# Patient Record
Sex: Female | Born: 1980 | Race: White | Hispanic: Yes | Marital: Single | Smoking: Never smoker
Health system: Southern US, Community
[De-identification: ages and names within clinical notes are randomized; demographics above are authoritative.]

## PROBLEM LIST (undated history)

## (undated) DIAGNOSIS — Z789 Other specified health status: Secondary | ICD-10-CM

## (undated) HISTORY — PX: CHOLECYSTECTOMY: SHX55

---

## 2007-03-31 ENCOUNTER — Inpatient Hospital Stay (HOSPITAL_COMMUNITY): Admission: AD | Admit: 2007-03-31 | Discharge: 2007-04-10 | Payer: Self-pay | Admitting: Obstetrics

## 2007-04-20 ENCOUNTER — Inpatient Hospital Stay (HOSPITAL_COMMUNITY): Admission: AD | Admit: 2007-04-20 | Discharge: 2007-04-20 | Payer: Self-pay | Admitting: Obstetrics

## 2007-04-21 ENCOUNTER — Inpatient Hospital Stay (HOSPITAL_COMMUNITY): Admission: AD | Admit: 2007-04-21 | Discharge: 2007-04-21 | Payer: Self-pay | Admitting: Obstetrics

## 2007-06-27 ENCOUNTER — Inpatient Hospital Stay (HOSPITAL_COMMUNITY): Admission: AD | Admit: 2007-06-27 | Discharge: 2007-06-27 | Payer: Self-pay | Admitting: Obstetrics

## 2007-06-28 ENCOUNTER — Inpatient Hospital Stay (HOSPITAL_COMMUNITY): Admission: AD | Admit: 2007-06-28 | Discharge: 2007-06-28 | Payer: Self-pay | Admitting: Obstetrics

## 2007-06-29 ENCOUNTER — Inpatient Hospital Stay (HOSPITAL_COMMUNITY): Admission: AD | Admit: 2007-06-29 | Discharge: 2007-07-03 | Payer: Self-pay | Admitting: Obstetrics

## 2007-06-30 ENCOUNTER — Encounter: Payer: Self-pay | Admitting: Obstetrics & Gynecology

## 2010-08-03 ENCOUNTER — Other Ambulatory Visit (HOSPITAL_COMMUNITY): Payer: Self-pay | Admitting: Family Medicine

## 2010-08-03 ENCOUNTER — Other Ambulatory Visit: Payer: Self-pay

## 2010-08-03 ENCOUNTER — Emergency Department (HOSPITAL_COMMUNITY)
Admission: EM | Admit: 2010-08-03 | Discharge: 2010-08-03 | Payer: Self-pay | Source: Home / Self Care | Admitting: Family Medicine

## 2010-08-03 LAB — POCT URINALYSIS DIPSTICK
Specific Gravity, Urine: 1.02 (ref 1.005–1.030)
Urine Glucose, Fasting: NEGATIVE mg/dL
Urobilinogen, UA: 0.2 mg/dL (ref 0.0–1.0)
pH: 6 (ref 5.0–8.0)

## 2010-08-03 LAB — AST: AST: 20 U/L (ref 0–37)

## 2010-08-03 LAB — ALT: ALT: 15 U/L (ref 0–35)

## 2010-08-07 ENCOUNTER — Ambulatory Visit (HOSPITAL_COMMUNITY)
Admission: RE | Admit: 2010-08-07 | Discharge: 2010-08-07 | Disposition: A | Discharge status: Home / Self Care | Payer: Self-pay | Source: Ambulatory Visit

## 2010-08-07 DIAGNOSIS — K8020 Calculus of gallbladder without cholecystitis without obstruction: Secondary | ICD-10-CM | POA: Insufficient documentation

## 2010-08-07 DIAGNOSIS — R1011 Right upper quadrant pain: Secondary | ICD-10-CM | POA: Insufficient documentation

## 2010-08-08 ENCOUNTER — Encounter: Payer: Self-pay | Admitting: Family Medicine

## 2010-09-03 HISTORY — PX: OTHER SURGICAL HISTORY: SHX169

## 2010-09-10 ENCOUNTER — Encounter (HOSPITAL_COMMUNITY)
Admission: RE | Admit: 2010-09-10 | Discharge: 2010-09-10 | Disposition: A | Discharge status: Home / Self Care | Payer: Self-pay | Source: Ambulatory Visit | Attending: Surgery | Admitting: Surgery

## 2010-09-10 DIAGNOSIS — Z01812 Encounter for preprocedural laboratory examination: Secondary | ICD-10-CM | POA: Insufficient documentation

## 2010-09-10 LAB — CBC
HCT: 40.1 % (ref 36.0–46.0)
Hemoglobin: 13.2 g/dL (ref 12.0–15.0)
MCH: 26 pg (ref 26.0–34.0)
MCV: 79.1 fL (ref 78.0–100.0)
RDW: 14.4 % (ref 11.5–15.5)

## 2010-09-11 ENCOUNTER — Other Ambulatory Visit (HOSPITAL_COMMUNITY): Payer: Self-pay

## 2010-09-14 ENCOUNTER — Ambulatory Visit (HOSPITAL_COMMUNITY)
Admission: RE | Admit: 2010-09-14 | Discharge: 2010-09-14 | Disposition: A | Discharge status: Home / Self Care | Payer: Self-pay | Source: Ambulatory Visit | Attending: Surgery | Admitting: Surgery

## 2010-09-14 ENCOUNTER — Other Ambulatory Visit: Payer: Self-pay | Admitting: Surgery

## 2010-09-14 ENCOUNTER — Ambulatory Visit (HOSPITAL_COMMUNITY): Payer: Self-pay

## 2010-09-14 DIAGNOSIS — K8010 Calculus of gallbladder with chronic cholecystitis without obstruction: Secondary | ICD-10-CM | POA: Insufficient documentation

## 2010-09-15 NOTE — Op Note (Signed)
NAMECHEYNE, Ashley Garza  ACCOUNT NO.:  [id number]  MEDICAL RECORD NO.:  [id number]           PATIENT TYPE:  O  LOCATION:  SDSC                         FACILITY:  [facility]  PHYSICIAN:  Wilmon Arms. Corliss Skains, M.D. DATE OF BIRTH:  03/18/1981  DATE OF PROCEDURE:  09/14/2010 DATE OF DISCHARGE:  09/14/2010                              OPERATIVE REPORT   PREOPERATIVE DIAGNOSIS:  Chronic calculus cholecystitis.  POSTOPERATIVE DIAGNOSIS:  Chronic calculus cholecystitis.  PROCEDURE PERFORMED:  Laparoscopic cholecystectomy with intraoperative cholangiogram.  SURGEON:  Wilmon Arms. Dannya Pitkin, MD  ANESTHESIA:  General.  INDICATIONS:  This is a 30 year old female who presents with several episodes of right-sided abdominal pain radiating through to her back. Ultrasound showed multiple gallstones.  Liver function tests were normal.  She presents now for cholecystectomy.  DESCRIPTION OF PROCEDURE:  The patient was brought to the operating room and placed in supine position on the operating room table.  After an adequate level of general anesthesia was obtained, the patient's abdomen was prepped with ChloraPrep and draped in sterile fashion.  Time-out was taken to assure the proper patient, proper procedure.  We infiltrated the area below the umbilicus with 0.25% Marcaine with epinephrine.  A transverse incision was made.  Dissection was carried down to the fascia.  The fascia was opened vertically.  We entered the peritoneal cavity bluntly.  A stay suture of 0 Vicryl was placed in the fascial opening.  The Hasson cannula was inserted and secured to stay suture. Pneumoperitoneum was obtained by insufflating CO2, maintaining a maximum pressure of 15 mmHg.  The laparoscope was inserted and the patient was positioned in reverse Trendelenburg tilted to her left.  An 11-mm port was placed in the subxiphoid position.  Two 5-mm ports were placed in the right upper quadrant.  The gallbladder was grasped  with a clamp and lifted.  There were lot of adhesions to the gallbladder.  These were taken down bluntly and with cautery.  We opened the peritoneum around the hilum of the gallbladder.  The cystic duct and cystic artery was circumferentially dissected.  We ligated the cystic duct with a clip distally.  A small opening was created on the cystic duct.  A Cook cholangiogram catheter was inserted through a stab incision and threaded into the cystic duct.  A cholangiogram was then obtained which showed good flow proximally and distally in the biliary tree with no sign of filling defect.  The catheter was removed, and the cystic duct was ligated with clips and divided.  The cystic artery was ligated with clips and divided.  Cautery was then used to dissect the gallbladder free from the liver.  Some bile was spilled but no stones were noted. The gallbladder was placed in an Endocatch sac.  We thoroughly irrigated the gallbladder fossa.  Hemostasis was obtained with cautery.  We suctioned at the irrigation.  The gallbladder and the Endocatch sac were removed through the umbilical port site.  A pursestring suture was used to close the umbilical fascia.  We again irrigated the right upper quadrant. Pneumoperitoneum was then released as we removed the trocars.  A 4-0 Monocryl was used to close the skin incisions.  Steri-Strips and clean  dressings were applied.  The patient was extubated and brought to recovery in stable condition.  All sponge, instrument, needle counts were correct.     Wilmon Arms. Corliss Skains, M.D.     MKT/MEDQ  D:  09/14/2010  T:  09/15/2010  Job:  [id number]  Electronically Signed by Manus Rudd M.D. on 09/15/2010 01:09:51 PM

## 2010-10-26 ENCOUNTER — Other Ambulatory Visit (HOSPITAL_COMMUNITY): Payer: Self-pay | Admitting: Family Medicine

## 2010-11-17 NOTE — Op Note (Signed)
NAMEJENNIFFER, VESSELS  ACCOUNT NO.:  [id number]   MEDICAL RECORD NO.:  [id number]          PATIENT TYPE:  INP   LOCATION:  9125                          [facility]   PHYSICIAN:  Roseanna Rainbow, M.D.DATE OF BIRTH:  07-Mar-1981   DATE OF PROCEDURE:  06/30/2007  DATE OF DISCHARGE:                               OPERATIVE REPORT   PREOPERATIVE DIAGNOSES:  1. Intrauterine pregnancy at 34+ weeks.  2. Oligohydramnios.  3. Elevated umbilical artery Doppler systolic/diastolic ratio.  4. Breech presentation.   POSTOPERATIVE DIAGNOSES:  1. Intrauterine pregnancy at 34+ weeks.  2. Oligohydramnios.  3. Elevated umbilical artery Doppler systolic/diastolic ratio.  4. Breech presentation.   PROCEDURE:  Primary low uterine flap elliptical cesarean delivery via  Pfannenstiel skin incision.   SURGEON:  Roseanna Rainbow, M.D.   ANESTHESIA:  Spinal.   PATHOLOGY:  Placenta.   ESTIMATED BLOOD LOSS:  600 mL.   COMPLICATIONS:  None.   INTRAVENOUS FLUIDS:  Per Anesthesiology.   DESCRIPTION OF PROCEDURE:  The patient was taken to the operating room  with an IV running.  A spinal anesthetic was then administered.  She was  placed in the dorsal supine position with a leftward tilt and prepped  and draped in the usual sterile fashion.  After a time out had been  completed, a Pfannenstiel skin incision was then made with the scalpel  and carried down to the underlying fascia.  The fascia was nicked in the  midline.  The fascial incision was then extended bilaterally with curved  Mayo scissors.  The superior aspect of the fascial incision was tented  up and the underlying rectus muscles dissected off.  The inferior aspect  of the fascial incision was manipulated in a similar fashion.  The  rectus muscles were separated in the midline.  The parietal peritoneum  was tented up and entered sharply.  This incision was then extended  superiorly and inferiorly with good  visualization of the bladder.  The  Alexis retractor was then placed into the incision.  The vesicouterine  peritoneum was tented up and entered sharply.  This incision was then  extended bilaterally and the bladder flap created bluntly.  The lower  uterine segment was then incised in a transverse fashion with the  scalpel.  The incision was then extended bluntly.  The position of the  infant was complete breech.  A complete breech extraction was then  performed.  The infant's head was delivered atraumatically.  The infant  was handed off to the awaiting neonatologist.  An umbilical artery pH  was sent and was 7.34.  The placenta was then removed.  The intrauterine  cavity was evacuated of any remaining amniotic fluid, clots, and debris  with a moist laparotomy sponge.  The uterine incision was then  reapproximated in a running interlocking fashion using 0 Monocryl.  A  second imbricating layer of the same suture was then placed.  Adequate  hemostasis was noted.  The paracolic gutters were then irrigated.  The  retractor was then removed.  The parietal peritoneum was then  reapproximated in a running fashion using 2-0 Vicryl.  The fascia was  closed in  a running fashion using a running suture of 0 Vicryl.  The  skin was closed with staples.  At the close of the procedure, the  instrument and pack counts were said to be correct x2.  A gram of  cephazolin had been given at cord clamp.  The patient was taken to the  PACU awake and in stable condition.      Roseanna Rainbow, M.D.  Electronically Signed     LAJ/MEDQ  D:  06/30/2007  T:  06/30/2007  Job:  [id number]   cc:   Kathreen Cosier, M.D.  Fax: [phone]

## 2010-11-20 ENCOUNTER — Inpatient Hospital Stay (HOSPITAL_COMMUNITY)
Admission: AD | Admit: 2010-11-20 | Discharge: 2010-11-20 | Disposition: A | Discharge status: Home / Self Care | Payer: Self-pay | Source: Ambulatory Visit | Attending: Family Medicine | Admitting: Family Medicine

## 2010-11-20 DIAGNOSIS — O209 Hemorrhage in early pregnancy, unspecified: Secondary | ICD-10-CM | POA: Insufficient documentation

## 2010-11-20 LAB — CBC
HCT: 39.3 % (ref 36.0–46.0)
Hemoglobin: 12.9 g/dL (ref 12.0–15.0)
MCH: 27.2 pg (ref 26.0–34.0)
Platelets: 281 10*3/uL (ref 150–400)
RBC: 4.75 MIL/uL (ref 3.87–5.11)
WBC: 7.9 10*3/uL (ref 4.0–10.5)

## 2010-11-20 LAB — HCG, QUANTITATIVE, PREGNANCY: hCG, Beta Chain, Quant, S: 64 m[IU]/mL — ABNORMAL HIGH (ref <5)

## 2010-11-20 LAB — URINALYSIS, ROUTINE W REFLEX MICROSCOPIC
Glucose, UA: NEGATIVE mg/dL
Specific Gravity, Urine: 1.015 (ref 1.005–1.030)
pH: 6 (ref 5.0–8.0)

## 2010-11-20 LAB — POCT PREGNANCY, URINE: Preg Test, Ur: NEGATIVE

## 2010-11-20 LAB — WET PREP, GENITAL: Yeast Wet Prep HPF POC: NONE SEEN

## 2010-11-20 LAB — URINE MICROSCOPIC-ADD ON

## 2010-11-20 NOTE — Discharge Summary (Signed)
NAMEAERIELLE, Ashley Garza  ACCOUNT NO.:  [id number]   MEDICAL RECORD NO.:  [id number]          PATIENT TYPE:  INP   LOCATION:  9320                          [facility]   PHYSICIAN:  Kathreen Cosier, M.D.DATE OF BIRTH:  08/13/1980   DATE OF ADMISSION:  06/29/2007  DATE OF DISCHARGE:  07/03/2007                               DISCHARGE SUMMARY   This is a 30 year old primigravida, [facility] January 31, who had premature  ruptures from 21 weeks and was kept on bed rest for the duration of her  pregnancy.  She was seen,  and had severe oligohydramnios with IUGR at  34 weeks and she was also breech presentation.  She had elevated  umbilical artery Dopplers and was scheduled for an elective C-section on  December 31.  She came in on December 25 with decreased fetal movement  and underwent primary low transverse cesarean section and a viable fetus  weighing 3 pounds and 14 ounces was stopped.  The patient did well.  Her  hemoglobin was 11.2.  She was discharged on the third postoperative day  and return to regular diet, on Tylox for pain to see me in six weeks.   DISCHARGE DIAGNOSES:  1. Status post prolonged rupture of membranes from 21 weeks.  2. Breech presentation.  3. Decreased fetal movement.  4. Increased umbilical artery Dopplers.           ______________________________  Kathreen Cosier, M.D.     BAM/MEDQ  D:  07/20/2007  T:  07/20/2007  Job:  [id number]

## 2010-11-20 NOTE — Discharge Summary (Signed)
NAMELAVINIA, MCNEELY  ACCOUNT NO.:  [id number]   MEDICAL RECORD NO.:  [id number]          PATIENT TYPE:  INP   LOCATION:  9157                          [facility]   PHYSICIAN:  Kathreen Cosier, M.D.DATE OF BIRTH:  08-20-1980   DATE OF ADMISSION:  03/31/2007  DATE OF DISCHARGE:  04/10/2007                               DISCHARGE SUMMARY   The patient is a 30 year old gravida 1, EDC August 05, 2007, 21+ weeks  pregnant, who was admitted because she was leaking fluid x1 week.  She  had no pain.  Ultrasound showed normal-appearing fetus with normal  kidneys but no amniotic fluid, so the admitting diagnosis was ruptured  membranes at 21 weeks.  The patient was observed and her temperatures  all remained normal and there was minimal leakage of fluid over the  course of the next few days.  The patient refused termination of the  pregnancy and she did not want to be hospitalized, so she was taught to  use a thermometer and she was instructed through the interpreter to be  on total bed rest at home, no sex, no lifting, to do her temperatures  every 4 hours and if she had a temperature elevation above 100.5, she  was to come immediately to the hospital.  It was explained that she  could be infected and would lose her baby and possibly her uterus.  The  patient stated that she understood.  She had an ultrasound prior to  discharge and the AFI was 4.7, up from 3.7.  she was discharged on total  bed rest, to see me in the office weekly, take her temperature every 4  hours.   DISCHARGE DIAGNOSIS:  Status post ruptured membranes at 21 weeks.           ______________________________  Kathreen Cosier, M.D.     BAM/MEDQ  D:  05/03/2007  T:  05/03/2007  Job:  [id number]

## 2010-11-21 LAB — GC/CHLAMYDIA PROBE AMP, GENITAL: Chlamydia, DNA Probe: NEGATIVE

## 2010-11-22 ENCOUNTER — Inpatient Hospital Stay (HOSPITAL_COMMUNITY)
Admission: AD | Admit: 2010-11-22 | Discharge: 2010-11-22 | Disposition: A | Discharge status: Home / Self Care | Payer: Self-pay | Source: Ambulatory Visit | Attending: Obstetrics & Gynecology | Admitting: Obstetrics & Gynecology

## 2010-11-22 DIAGNOSIS — O209 Hemorrhage in early pregnancy, unspecified: Secondary | ICD-10-CM | POA: Insufficient documentation

## 2010-11-27 ENCOUNTER — Inpatient Hospital Stay (HOSPITAL_COMMUNITY)
Admission: AD | Admit: 2010-11-27 | Discharge: 2010-11-27 | Disposition: A | Discharge status: Home / Self Care | Payer: Self-pay | Source: Ambulatory Visit | Attending: Obstetrics & Gynecology | Admitting: Obstetrics & Gynecology

## 2010-11-27 DIAGNOSIS — O039 Complete or unspecified spontaneous abortion without complication: Secondary | ICD-10-CM

## 2010-12-18 ENCOUNTER — Ambulatory Visit: Payer: Self-pay | Admitting: Advanced Practice Midwife

## 2011-04-09 LAB — CBC
HCT: 33.3 — ABNORMAL LOW
Hemoglobin: 11.2 — ABNORMAL LOW
MCV: 82.6
RBC: 4.02
RBC: 4.16
RDW: 16.3 — ABNORMAL HIGH
WBC: 11.8 — ABNORMAL HIGH
WBC: 14.8 — ABNORMAL HIGH

## 2011-04-09 LAB — RPR: RPR Ser Ql: NONREACTIVE

## 2011-04-15 LAB — CBC
HCT: 32.6 — ABNORMAL LOW
Hemoglobin: 11.2 — ABNORMAL LOW
MCHC: 34.4
MCV: 85.3
RDW: 14.8 — ABNORMAL HIGH

## 2011-04-21 ENCOUNTER — Other Ambulatory Visit: Payer: Self-pay | Admitting: Advanced Practice Midwife

## 2011-04-21 ENCOUNTER — Ambulatory Visit (INDEPENDENT_AMBULATORY_CARE_PROVIDER_SITE_OTHER): Payer: Self-pay | Admitting: Family Medicine

## 2011-04-21 ENCOUNTER — Other Ambulatory Visit: Payer: Self-pay | Admitting: Family Medicine

## 2011-04-21 VITALS — BP 111/72 | Temp 97.7°F | Ht 61.5 in | Wt 153.0 lb

## 2011-04-21 DIAGNOSIS — O09219 Supervision of pregnancy with history of pre-term labor, unspecified trimester: Secondary | ICD-10-CM

## 2011-04-21 DIAGNOSIS — Z3682 Encounter for antenatal screening for nuchal translucency: Secondary | ICD-10-CM

## 2011-04-21 DIAGNOSIS — Z3480 Encounter for supervision of other normal pregnancy, unspecified trimester: Secondary | ICD-10-CM

## 2011-04-21 DIAGNOSIS — O09899 Supervision of other high risk pregnancies, unspecified trimester: Secondary | ICD-10-CM | POA: Insufficient documentation

## 2011-04-21 DIAGNOSIS — Z23 Encounter for immunization: Secondary | ICD-10-CM

## 2011-04-21 LAB — POCT URINALYSIS DIP (DEVICE)
Leukocytes, UA: NEGATIVE
Nitrite: NEGATIVE
Protein, ur: NEGATIVE mg/dL
Urobilinogen, UA: 0.2 mg/dL (ref 0.0–1.0)
pH: 6 (ref 5.0–8.0)

## 2011-04-21 LAB — HIV ANTIBODY (ROUTINE TESTING W REFLEX): HIV: NONREACTIVE

## 2011-04-21 MED ORDER — INFLUENZA VIRUS VACC SPLIT PF IM SUSP: 0.5000 mL | Freq: Once | INTRAMUSCULAR | Status: DC

## 2011-04-21 MED ORDER — PRENATAL 19 PO CHEW: 1.0000 | CHEWABLE_TABLET | Freq: Every day | ORAL | Status: DC

## 2011-04-21 NOTE — Patient Instructions (Signed)
Cuidados prenatales Atencin antes del parto (Prenatal Care, Antepartum Care) QU SON LOS CUIDADOS PRENATALES?  Cuidados prenatales significan la asistencia de la salud antes de que nazca el beb. Cudese usted y tambin cuide al beb del siguiente modo:   Siga los cuidados prenatales desde comienzos del Psychiatrist. Si sabe que est embarazada o piensa que podra estarlo, comunquese con su mdico lo antes posible. Arregle una visita para un examen general o prenatal.   Siga los cuidados prenatales de [place] regular. Siga el esquema para realizar anlisis de [place] y otras pruebas necesarias y Joelyn Oms a todas las citas.   Si hace todo esto podr [employer] y la del beb durante todo el Psychiatrist.   Los cuidados prenatales deben incluir una evaluacin de las necesidades mdicas, [place], [place], psicolgicas y [place]ie para la pareja y la historia [place], [place] y gentica de la familia de la madre y el padre.   Comente con su mdico:   Los medicamentos prescriptos, de venta libre y las hierbas medicinales que toma.   Si consume drogas, alcohol o fuma.   Si sufre violencia o abuso familiar.   Las vacunas que ha recibido.   Su nutricin y Surveyor, mining.   La actividad fsica Development worker, international aid.   Peligros ambientales y Forensic psychologist, del hogar y [place].   Historia de enfermedades de transmisin sexual que hayan sufrido usted y Risk analyst.   Embarazos previos.  PORQU LOS CUIDADOS PRENATALES SON TAN IMPORTANTES?  Si la controla con regularidad, el profesional tendr la oportunidad de Clinical research associate problemas precozmente, de modo que puede tratarlos lo antes posible. Tambin puede llegar a Photographer. Muchos estudios han demostrado que una atencin prenatal temprana y regular es importante tanto para la salud de las madres como de sus bebs.  Concha Se EMBARAZADA COMO PUEDO CUIDARME?  El cuidado de la mujer antes de quedar embarazada la ayuda a Warehouse manager un Psychiatrist  saludable y a Technical sales engineer las posibilidades de tener un beb con problemas. Estas son las formas de cuidarse antes de quedan embarazada:   Consuma alimentos saludables, realice actividad fsica regularmente (lo ms aconsejable es 30 minutos por da, [employer]) y descanse y duerma lo suficiente. Converse con el profesional acerca de cules son los alimentos y ejercicios que ms le convienen.   Tome 400 microgramos (mcg) de cido flico (una de las vitaminas del grupo B) todos [place]. Lo ms aconsejable es tomar un multivitamnico que contenga esta cantidad de cido flico. Si toma la cantidad suficiente de cido flico sinttico (manufacturado) todos los [place], antes de [place] y a comienzos del Psychiatrist, podr evitar que el beb sufra ciertos defectos. Hay cereales para el desayuno y otros productos que contienen granos a los que se les ha agregado cido flico, pero no todos contienen 400 mcg por porcin. Controle las etiquetas del multivitamnico o del cereal para conocer la cantidad de cido flico que contienen.   Verifique que cuenta con todas las vacunaciones, especialmente la de la [place]. La rubola puede causar graves defectos al beb. La varicela es otra enfermedad que debe evitarse durante el [place]. Si ha sufrido varicela o rubola en el pasado, usted est inmunizada.   Informe al mdico acerca de todos los medicamentos prescriptos, de venta libre o hierbas que toma. Algunos medicamentos no son seguros para tomarlos Academic librarian.   Deje de fumar, de beber alcohol o de tomar drogas. Pdale ayuda al mdico, si es necesario.   Comente y trate cualquier  problema mdico, social o psicolgico antes de quedar embarazada.   Comente cualquier historia de problemas genticos en su familia o en la del padre. Siempre que sea posible, haga pruebas genticas antes de quedar embarazada.   Comente a su mdico si es vctima de maltratos fsicos o emocionales.   Comente con el  profesional si est expuesta a sustancias qumicas perjudiciales en su lugar de trabajo o en el lugar en el que vive.   Comente con el profesional si considera que su trabajo o las horas que trabaja pueden ser perjudiciales y debera modificarlos.   El padre debe estar incluido en todas las tomas de decisiones con todos los aspectos del [place], el [place] de parto y Interior.   Si tiene [employer] Home	[place], asegrese que esta cubierta por Psychiatrist.  RECIN ME ENTERO QUE ESTOY EMBARAZADA COMO PUEDO CUIDARME?  Aqu hay algunas indicaciones para cuidarse usted misma y la preciosa nueva vida que crece en su interior.   Contine tomando el multivitamnico con 400 microgramos (mcg) de cido [employer].   Siga los cuidados prenatales desde comienzos del embarazo. No importa si este es Contractor o si ya tiene otros nios. Es importante que consulte con un profesional durante el Psychiatrist. El Facilities manager en cada visita para verificar que usted y el beb estn sanos. Si hubiera problemas, tomar medidas para ayudarla a usted o a su beb.   Consuma una dieta saludable que incluya.   Frutas  Vegetales   Alimentos que contengan pocas grasas saturadas.   Granos.  Alimentos ricos en calcio.   Beba entre 6 y 8 vasos de lquidos por Futures trader.    Excepto que el profesional le indique lo contrario, trate de mantenerse fsicamente activa durante 30 minutos, casi todos los [address]  [address] de la [place]. Si no tiene tiempo, realice [place] en segmentos de 10 minutos, tres Teacher, early years/pre.   Si fuma, bebe alcohol o consume drogas DEJE DE HACERLO. Estas sustancias pueden causar daos crnicos al beb. Converse con el profesional que la asiste con respecto a los pasos a seguir para International aid/development worker. Converse con algn miembro de la comunidad religiosa, terapeuta, un amigo de confianza o su mdico, si est preocupada con respecto a su consumo de alcohol o drogas.   Consulte con el profesional antes  de tomar [employer], an los de [employer]. Algunos medicamentos no son seguros para tomarlos Academic librarian.   Descanse y duerma lo suficiente.   Evite jacuzzis y saunas durante el embarazo   No pueden tomarle radiografas, excepto que sea absolutamente necesario y con autorizacin del mdico. Le colocarn una pantalla de plomo sobre el abdomen para proteger al beb cuando los rayos x ingresen a su organismo   No limpie el lecho del gato si est embarazada. Puede contener un parsito que causa una infeccin denominada toxoplasmosis, que ocasiona defectos en el beb. Tambin utilice guantes cuando trabaje en las zonas del jardn en las que se encuentra el [place].   No coma carne, pescado o queso crudo o mal cocido.   Permanezca alejada de sustancias txicas como:   Insecticidas.  Solventes (como algunos limpiadores o disolvente de pinturas).   Plomo.  Mercurio.    Puede tener relaciones sexuales hasta el final del embarazo excepto que sufra algn problema mdico o tenga alguna dificultad en el embarazo y su mdico le aconseje que no las tenga.   No use tacones altos, especialmente durante la segunda mitad  del embarazo. Puede perder el equilibrio y caerse.   No haga viajes largos excepto que sea absolutamente necesario. Asegrese de [employer] visita al mdico antes de hacer el viaje.   No permanezca sentada en una posicin durante ms de 2 horas cuando viaje.   Lleve una copia de los registros mdicos cuando vaya de viaje.   Averige dnde [facility] en la ciudad que visitar, en caso de emergencia.   Los productos de limpieza ms peligrosos tienen advertencias para la mujer embarazada en la etiqueta. Consulte con el profesional con respecto a los productos si no est segura.   Limite o elimine su consumo de cafena proveniente del caf, t, gaseosas, medicamentos y chocolate.   Muchas mujeres siguen trabajando Academic librarian. Permanecer activa la ayuda  a mantenerse sana. Si tiene preguntas relacionadas con la seguridad o las horas que trabaja, consltelo con el profesional que la asiste.   Mantngase informada:   Lea libros.  Mire vdeos.   Concurra con el padre a las clases de preparto.  Converse con otras mams experimentadas.    Consulte con el profesional con respecto a las clases de preparto para usted y Camera operator. Las clases la ayudarn a usted y su compaero a prepararse para el nacimiento de su beb.   Busque un pediatra (mdico de bebs) y Civil engineer, contracting de los mtodos para Acupuncturist dolor durante el [place] de [place], [place] parto y la probabilidad de Neomia Dear cesrea.  NO PIENSO QUEDAR EMBARAZADA TODAVA, PERO HE ESCUCHADO QUE TODAS LAS MUJERES DEBEN TOMAR CIDO FLICO TODOS LOS DAS.  Todas las [employer] en edad frtil, an aquellas que tengan una posibilidad [place] de quedar embarazadas, deben tratar de tomar una cantidad suficiente de cido flico Muchos embarazos no son planeados. Muchas mujeres no saben que estn embarazadas y ciertos defectos congnitos se producen a comienzos del Psychiatrist. Tomar 400 microgramos (mcg) de cido flico todos los [employer] ayudara a Automotive engineer ciertos defectos congnitos que se producen a comienzos del Psychiatrist. Si una mujer comienza a tomar vitaminas en el segundo o tercer mes del [place], podra ser muy tarde para evitar algunos defectos congnitos. El cido flico puede tener otros efectos beneficiosos en la salud de la [place], [place] de prevenir defectos cngnitos.  CON QU FRECUENCIA DEBO VISITAR AL MDICO DURANTE EL EMBARAZO?  El Nurse, mental health sus visitas prenatales. A medida que est ms cerca del final del embarazo, las visitas sern ms frecuentes. Un embarazo promedio dura alrededor de 40 semanas.  Una programacin de visitas tpica consiste en:   Aproximadamente una por mes durante los primeros seis meses de [place].   Cada dos [employer] meses siguientes.   Una vez por  semana en el ltimo mes, hasta la fecha de [place].  El profesional querr verla ms a menudo.  Si tiene mas de 35 aos de edad.   Su embarazo es de alto riesgo debido a que usted tiene Therapist, sports de salud o problemas con el embarazo como:   Diabetes.   Presin arterial elevada.   El beb no se desarrolla segn las pautas, de acuerdo con la fecha del Psychiatrist.  En estos casos la sometern a pruebas especiales para verificar que ni usted ni el beb tengan problemas graves. QU SUCEDE DURANTE LAS VISITAS PRENATALES?   En su primera visita, el profesional conversar con usted y su compaero acerca de la historia clnica de ambos y de sus respectivas familias, y Civil engineer, contracting un examen fsico.  En su primera visita, el examen mdico incluir controles de la presin arterial, el peso y la altura y un examen de los rganos plvicos. El mdico har un [place] si no tiene uno reciente, y har cultivos del cuello del tero para descartar infecciones.   En cada visita deber realizar anlisis de sangre, orina, tomarse la presin arterial, controlar el peso y verificar el progreso del beb.   El profesional podr decirle cuando es la fecha probable en la que el beb nacer.   En cada visita tambin tendr la posibilidad de aprender a mantenerse saludable durante el embarazo y podr hacer preguntas.   Comente con su mdico si est decidida a amamantar.   El Hospital doctor como se desarrolla el beb. Le realizarn varios tipos de anlisis a medida que el Financial planner.   Le tomarn ecografas para controlar el desarrollo y la salud del beb.   Podrn solicitarle otros anlisis de sangre si es necesario. Podr incluir una amniocentesis (examen del lquido amnitico), pruebas de estrs (controla como responde el beb a las contracciones), el perfil biofsico (mediciones del feto). El Airline pilot acerca de los [place] y porqu son necesarios.  ESTOY POR CUMPLIR 40  AOS Y QUIERO TENER UN HIJO DEBO TOMAR PRECAUCIONES ESPECIALES?  A medida que una mujer envejece, tiene ms probabilidades de tener problemas mdicos (hipertensin arterial), problemas del embarazo (preeclampsia, problemas con la placenta), aborto espontneo o que el nio nazca con un defecto congnito. Sin embargo, la [employer] de las mujeres a final de la dcada de los 30 aos o a comienzo de los 40, tienen bebs sanos. Consulte con el profesional de [place] regular antes de [place] y Clinical biochemist todos los exmenes que se le solicitarn durante el [place]. El profesional probablemente querr que se someta a algunas pruebas especiales para usted y su beb.  Actualmente las mujeres postergan la decisin de tener hijos hasta cuando tienen treinta o [place]. Aunque muchas mujeres de esta edad no tienen dificultad para quedar embarazadas, a medida que se envejece, la fertilidad declina. Las mujeres de ms de 40 aos que no pueden quedar embarazadas luego de intentarlo durante 6 meses, debern someterse a una evaluacin de su fertilidad. No es poco frecuente tener problemas para quedar embarazada o sufrir infertilidad (imposibilidad para quedar embarazada luego de tratar durante un ao). Si cree que usted o su compaero podran ser infrtiles, comntelo con el profesional que la asiste, quin puede recomendarle tratamientos con medicamentos, [place] o tecnologa para la reproduccin asistida.  Document Released: 12/08/2007 Document Re-Released: 12/09/2009 [facility] Patient Information 2011 [place], [place].

## 2011-04-21 NOTE — Progress Notes (Signed)
Patient seen.  Mild nausea with occasional vomiting, worse when taking prenatal vitamin.  First pregnancy had PPROM at 21 weeks.  Was on bed rest until 36 weeks, when had cesarean section for decreased fetal activity, severe oligohydramnios, and IUGR.    General appearance: alert, cooperative, appears stated age and no distress Head: Normocephalic, without obvious abnormality, atraumatic Eyes: conjunctivae/corneas clear. PERRL, EOM's intact. Fundi benign. Neck: no adenopathy, no carotid bruit, no JVD, supple, symmetrical, trachea midline and thyroid not enlarged, symmetric, no tenderness/mass/nodules Lungs: clear to auscultation bilaterally Heart: regular rate and rhythm, S1, S2 normal, no murmur, click, rub or gallop Abdomen: soft, non-tender; bowel sounds normal; no masses,  no organomegaly Pelvic: cervix normal in appearance, external genitalia normal, no adnexal masses or tenderness, no cervical motion tenderness, rectovaginal septum normal, uterus normal size, shape, and consistency and vagina normal without discharge Extremities: extremities normal, atraumatic, no cyanosis or edema Pulses: 2+ and symmetric  PAP, GC/chlamydia, wet prep done.  First Screen ordered.  Prenatal labs done.  Discussed with patient option for TOLAC - she will consider, but leaning toward repeat LTCS.  Follow up in 4 weeks at low risk clinic.  Fetal heart rate evaluated by bedside ultrasound.

## 2011-04-21 NOTE — Progress Notes (Signed)
Pulse- 85.  No vaginal discharge.  Pt would like different prenatal vitamins, makes her " feel nauseated". Pt needs all labs/ PAP

## 2011-04-22 LAB — OBSTETRIC PANEL
Antibody Screen: NEGATIVE
Basophils Absolute: 0 10*3/uL (ref 0.0–0.1)
Eosinophils Absolute: 0.1 10*3/uL (ref 0.0–0.7)
Eosinophils Relative: 1 % (ref 0–5)
HCT: 40.7 % (ref 36.0–46.0)
MCH: 28.3 pg (ref 26.0–34.0)
MCV: 82.7 fL (ref 78.0–100.0)
Monocytes Absolute: 0.4 10*3/uL (ref 0.1–1.0)
Platelets: 268 10*3/uL (ref 150–400)
RDW: 14.7 % (ref 11.5–15.5)

## 2011-04-22 LAB — WET PREP, GENITAL
Clue Cells Wet Prep HPF POC: NONE SEEN
Yeast Wet Prep HPF POC: NONE SEEN

## 2011-05-10 ENCOUNTER — Ambulatory Visit (HOSPITAL_COMMUNITY): Payer: Self-pay | Attending: Family Medicine

## 2011-05-10 ENCOUNTER — Ambulatory Visit (HOSPITAL_COMMUNITY): Payer: Self-pay

## 2011-05-19 ENCOUNTER — Encounter: Payer: Self-pay | Admitting: Obstetrics and Gynecology

## 2011-05-19 ENCOUNTER — Other Ambulatory Visit: Payer: Self-pay

## 2011-05-19 ENCOUNTER — Ambulatory Visit (INDEPENDENT_AMBULATORY_CARE_PROVIDER_SITE_OTHER): Payer: Self-pay | Admitting: Obstetrics and Gynecology

## 2011-05-19 VITALS — BP 109/76 | HR 90 | Temp 96.9°F | Wt 152.8 lb

## 2011-05-19 DIAGNOSIS — O219 Vomiting of pregnancy, unspecified: Secondary | ICD-10-CM

## 2011-05-19 DIAGNOSIS — Z3480 Encounter for supervision of other normal pregnancy, unspecified trimester: Secondary | ICD-10-CM

## 2011-05-19 DIAGNOSIS — Z98891 History of uterine scar from previous surgery: Secondary | ICD-10-CM | POA: Insufficient documentation

## 2011-05-19 DIAGNOSIS — Z23 Encounter for immunization: Secondary | ICD-10-CM

## 2011-05-19 DIAGNOSIS — O09219 Supervision of pregnancy with history of pre-term labor, unspecified trimester: Secondary | ICD-10-CM

## 2011-05-19 DIAGNOSIS — O09299 Supervision of pregnancy with other poor reproductive or obstetric history, unspecified trimester: Secondary | ICD-10-CM | POA: Insufficient documentation

## 2011-05-19 LAB — POCT URINALYSIS DIP (DEVICE)
Protein, ur: 30 mg/dL — AB
Specific Gravity, Urine: >=1.03 (ref 1.005–1.030)
Urobilinogen, UA: 0.2 mg/dL (ref 0.0–1.0)
pH: 6.5 (ref 5.0–8.0)

## 2011-05-19 MED ORDER — PROMETHAZINE HCL 12.5 MG PO TABS: 12.5000 mg | ORAL_TABLET | Freq: Four times a day (QID) | ORAL | Status: DC | PRN

## 2011-05-19 MED ORDER — PROMETHAZINE HCL 12.5 MG PO TABS: 12.5000 mg | ORAL_TABLET | Freq: Four times a day (QID) | ORAL | Status: AC | PRN

## 2011-05-19 MED ORDER — TETANUS-DIPHTH-ACELL PERTUSSIS 5-2.5-18.5 LF-MCG/0.5 IM SUSP
0.5000 mL | Freq: Once | INTRAMUSCULAR | Status: AC
  Administered 2011-05-19: 0.5 mL via INTRAMUSCULAR

## 2011-05-19 MED ORDER — TETANUS-DIPHTH-ACELL PERTUSSIS 5-2.5-18.5 LF-MCG/0.5 IM SUSP: 0.5000 mL | Freq: Once | INTRAMUSCULAR | Status: DC

## 2011-05-19 NOTE — Patient Instructions (Signed)
Sport and exercise psychologist  (Preterm Labor) El parto prematuro comienza antes de la semana 37 de [place]. La duracin de un embarazo normal es de 39 a 41 semanas.  CAUSAS  Generalmente no hay una causa que pueda identificarse. Sin embargo, una de las causas conocidas ms frecuentes son las infecciones. Las infecciones del tero, el cuello, la vagina, el lquido [place], la vejiga, los riones y Teacher, adult education de los pulmones (neumona) pueden hacer que el trabajo de parto se inicie. Otras causas son:   Infecciones urogenitales, como infecciones por hongos y vaginosis bacteriana.   Anormalidades uterinas (forma del tero, sptum uterino, fibromas, hemorragias en la placenta).   Un cuello que ha sido operado y se abre prematuramente.   Malformaciones del beb.   Gestaciones mltiples (mellizos, trillizos y ms).   Ruptura del saco amnitico.  :Otros factores de riesgo del parto prematuro son   Historia previa de Sport and exercise psychologist.   Ruptura prematura de las [place].   La placenta cubre la apertura del cuello (placenta previa).   La placenta se separa del tero (abrupcin placentaria).   El cuello es demasiado dbil para contener al beb en el tero (cuello incompetente).   Hay mucho lquido en el saco amnitico (polihidramnios).   Consumo de drogas o hbito de fumar durante Firefighter.   No aumentar de peso lo suficiente durante el [employer].   Mujeres menores de 18 aos o [address] de [address] [place].   Nivel socioeconmico bajo.   Raza afroamericana.  SNTOMAS  Los signos y sntomas son:   Clicos del tipo menstrual   Contracciones con un intervalo entre 30 y 70 segundos, comienzan a ser regulares, se hacen ms frecuentes y se hacen ms intensas y dolorosas.   Contracciones que comienzan en la parte superior del tero y se expanden hacia abajo, hacia la zona inferior del abdomen y la espalda.   Sensacin de presin en la pelvis o dolor en la espalda.   Aparece una secrecin acuosa o  sanguinolenta por la vagina.  DIAGNSTICO  El diagnstico puede confirmarse:   Con un examen vaginal.   Ecografa del cuello.   Muestra (hisopado) de las secreciones crvico-vaginales. Estas muestras se analizan para buscar la presencia de fibronectina fetal. Esta protena que se encuentra en las secreciones del tero y se asocia con el parto prematuro.   Monitoreo fetal  TRATAMIENTO  Segn el tiempo del Psychiatrist y otras [place], el mdico puede indicar reposo en cama. Si es necesario, le indicarn medicamentos para TEFL teacher las contracciones y apurar la maduracin de los pulmones del feto. Si el trabajo de parto se inicia antes de las 34 semanas de [place], se recomienda la hospitalizacin. El tratamiento depende de las condiciones en que se encuentre la madre y el beb.  PREVENCIN  Hay algunas cosas que [employer] puede hacer para disminuir el riesgo de trabajo de parto prematuro en futuros [employer]. Una mam puede:   Dejar de fumar.   Mantener un peso saludable y evitar sustancias qumicas y drogas innecesarias.   Controlar todo tipo de infeccin.   Informar al mdico si tiene una historia conocida de parto prematuro.  Document Released: 09/28/2007 Document Revised: 03/03/2011 [facility] Patient Information 2012 [place], [place].Nuseas matinales (Morning Sickness)  Se denominan nuseas matinales a las ganas de vomitar (nuseas) durante el Psychiatrist. Esta sensacin puede estar acompaada o no de vmitos. Aparecen por la maana, pero puede ser un problema a lo largo de [employer]. Aunque son molestas, generalmente no causan ningn dao,  excepto que presente vmitos continuos e intensos (hiperemesis gravdica). Este problema requiere un tratamiento ms intenso. CAUSAS La causa no se conoce, pero parece estar relacionado con un incremento sbito de dos hormonas:   La gonadotrofina corinica humana.   Los estrgenos.  Ambas hormonas elevan su nivel en la primera etapa del  embarazo. TRATAMIENTO No utilice ningn medicamento (prescripto, de venta libre ni hierbas) para este problema sin consultar con su mdico. Algunas pacientes obtienen ayuda haciendo lo siguiente:  Vitamin B6, 25mg  cada 8 horas o Vitamin B6 inyectable.   Un antihistamnico, doxylamina, 10 mg. cada 8 horas.   El jengibre, planta medicinal, parece ser de utilidad en [employer].  INSTRUCCIONES PARA EL CUIDADO DOMICILIARIO  Tomar un multivitamnico antes de quedar embarazada puede prevenir o disminuir la gravedad de las nuseas matinales en la mayoria de las [place].   Coma un trozo de [place] seca o cracker sin sal antes de levantarse de la cama por la maana.   Coma 5  6 comidas pequeas por da.   Consuma alimentos blandos y secos (arroz, papas asadas).   No beba lquidos con las comidas, hgalo entre comidas.   Evite los alimentos muy grasos o condimentados.   Pdale a otra persona que cocine para usted si [employer] de algn alimento le provoca nuseas o vmitos.   Evite los comprimidos vitamnicos con hierro, debido a que provoca nuseas.   Tome colaciones de alimentos proteicos entre comidas si siente apetito.   Coma gelatina sin azcar de postre.   Una pulsera de acupresin ( que se utiliza para Research scientist (life sciences) en viajes) puede ser de Micro.   La acupuntura puede ayudarla.   No fume.   Consiga un humidificador para Customer service manager de su casa libre de [place].  SOLICITE ATENCIN MDICA SI:  Los remedios caseros no funcionan y Media planner.   Se siente mareada o sufre un desmayo.   Pierde peso.   Necesita ayuda con su dieta.  SOLICITE ATENCIN MDICA DE INMEDIATO SI:  Tiene nuseas y vmitos de manera persistente y no puede controlarlos.   Se desmaya.   Tiene fiebre.  ASEGURESE QUE:   Comprende estas instrucciones.   Controlar su enfermedad.   Solicitar ayuda de inmediato si no mejora o si empeora.  Document Released: 10/07/2008 Document Revised:  03/03/2011 [facility] Patient Information 2012 [place], [place].

## 2011-05-19 NOTE — Progress Notes (Signed)
Addended by: Caren Griffins C on: 05/19/2011 10:29 AM   Modules accepted: Orders

## 2011-05-19 NOTE — Progress Notes (Signed)
Pain: c/o pain last Saturday that lasted for 3 days in back (8/10 pain scale) and pelvic (6/10 pain scale) Pressure: none

## 2011-05-19 NOTE — Progress Notes (Signed)
Reviewed OB history and C-section operative note. See risk factor section for relevant summary of 2008 pregnancy. She is a candidate for 17 P. This is explained today and she will come back to high risk clinic in 2 weeks to initiate 17 P. Does have transportation issues and would prefer to have weekly injections at home if that is possible.

## 2011-05-19 NOTE — Progress Notes (Signed)
N/V of preg doscussed and Rx Phenergan. Had tDAP today. [facility] MFM for nuchal translucency because now decided she does not want any genetic screening. Discussed abdominal tightening exercises and back mechanics. Anatomic ultrasound scheduled for 4 weeks. Return to high-risk clinic for initiation of 17 P in 2 weeks.

## 2011-06-03 ENCOUNTER — Other Ambulatory Visit: Payer: Self-pay | Admitting: Obstetrics and Gynecology

## 2011-06-03 ENCOUNTER — Ambulatory Visit (INDEPENDENT_AMBULATORY_CARE_PROVIDER_SITE_OTHER): Payer: Self-pay | Admitting: Physician Assistant

## 2011-06-03 DIAGNOSIS — Z3480 Encounter for supervision of other normal pregnancy, unspecified trimester: Secondary | ICD-10-CM

## 2011-06-03 DIAGNOSIS — O09219 Supervision of pregnancy with history of pre-term labor, unspecified trimester: Secondary | ICD-10-CM

## 2011-06-03 LAB — POCT URINALYSIS DIP (DEVICE)
Glucose, UA: 250 mg/dL — AB
Nitrite: NEGATIVE
Protein, ur: 100 mg/dL — AB
Specific Gravity, Urine: 1.025 (ref 1.005–1.030)
Urobilinogen, UA: 1 mg/dL (ref 0.0–1.0)

## 2011-06-03 MED ORDER — HYDROXYPROGESTERONE CAPROATE 250 MG/ML IM OIL
250.0000 mg | TOPICAL_OIL | INTRAMUSCULAR | Status: DC
  Administered 2011-06-03 – 2011-10-21 (×21): 250 mg via INTRAMUSCULAR

## 2011-06-03 NOTE — Patient Instructions (Signed)
Prevencin de Sport and exercise psychologist (Preventing Preterm Labor) Un parto prematuro ocurre cuando la mujer embarazada tiene contracciones uterinas que causan la apertura, el acortamiento y el afinamiento del cuello del tero, antes de las 37 semanas de [place]. Tendr contracciones regulares cada 2 a 3 minutos. Esto generalmente causa molestias o dolor. CUIDADOS EN EL HOGAR  Consuma una dieta saludable.   Johnson [employer] Johnson las vitaminas segn le haya indicado el mdico.   Beba una cantidad de lquido suficiente como para Pharmacologist la orina de tono claro o color amarillo plido todos [place].   Descanse y duerma.   No tenga relaciones sexuales si tiene un parto prematuro o alto riesgo de tenerlo.   Siga las instrucciones del mdico acerca de su [place], los medicamentos y los exmenes.   Evite el estrs.   Evite los esfuerzos extenuantes o la actividad fsica extensa.   No fume.  SOLICITE AYUDA DE INMEDIATO SI:   Tiene contracciones.   Siente dolor abdominal.   Tiene sangrado que proviene de la vagina.   Siente dolor al [employer].   Observa una secrecin anormal que proviene de la vagina.   Tiene una temperatura oral de ms de 102 F (38.9 C).  ASEGRESE DE QUE:  Comprende estas instrucciones.   Controlar su enfermedad.   Solicitar ayuda si no mejora o si empeora.  Document Released: 07/24/2010 Document Revised: 03/03/2011 [facility] Patient Information 2012 [place], [place].Embarazo y Media planner (Pregnancy and Medications) La mayor parte de las veces, los medicamentos que toma una mujer embarazada no pasan al feto, pero otras veces s lo hacen. Esto puede causar lesiones o defectos congnitos. El riesgo de lesiones al feto es mayor en las primeras semanas del [place]. En este momento es cuando se desarrollan los rganos principales. Si usted est tomando medicamentos prescriptos, de venta libre o hierbas medicinales, es mejor que consulte a su mdico antes de quedar [place]. Si queda  embarazada, deje de [employer] de venta libre y las hierbas medicinales inmediatamente. Comente con su mdico lo que est tomando. Tambin comntele qu medicamentos ha tomado antes de saber que estaba embarazada. Nunca tome otros medicamentos durante el embarazo, excepto que su mdico la autorice. Otras sustancias como cafena, vitaminas, ts y hierbas medicinales pueden afectar el desarrollo del feto. Converse con su mdico acerca de disminuir la cantidad de cafena. Tambin consulte qu tipo de vitaminas necesita recibir. Nunca use hierbas medicinales sin consultar a su mdico.  MEDICAMENTOS CUYO USO NO ES SEGURO DURANTE EL EMBARAZO  Categora A - medicamentos cuya seguridad ha sido probada durante el [place] y se ha hallado que son seguros. Esto incluye medicamentos como:   cido flico   Vitamina B6   Medicamentos para la tiroides con moderacin o en dosis prescriptas.   Categora B - medicamentos que se han utilizado durante el [place] y no parecen causar defectos congnitos u otros problemas. Esto incluye medicamentos como:   Algunos antibiticos   Acetaminofeno (Tylenol ).   Aspartamo (endulzante artificial).   Famotidina (Pepcid).   Prednisona (corticoides).   Insulina (para la diabetes).   Ibuprofeno (Advil, Motrin) antes del tercer trimestre. Las mujeres embarazadas no deben tomar ibuprofeno durante los ltimos tres meses del [place].   Categora C - medicamentos que probablemente causen problemas a la madre o al feto. Tambin incluye medicamentos cuyos estudios para verificar su seguridad no se han concluido. No hay estudios en curso para la mayora de [employer]. Estos medicamentos generalmente traen una advertencia, por lo que slo deben utilizarse  si los beneficios son [employer]. Esto es algo que una mujer debe analizar cuidadosamente con su mdico. Estos frmacos son:   Proclorperzaina (Compazine).   Sudafed.   Fluconazol  (Diflucan).   Ciprofloxacin (Cipro).   Tambin se incluye en este grupo a algunos antidepresivos.   Categora D - sustancias que claramente implican riesgos para la salud del feto. Ellas son:   Alcohol   Litio (usado para tratar trastornos manacodepresivos).   Phenytoin (Dilantin).   La mayora de la quimioterapia que se utiliza para el tratamiento del cncer. En algunos casos, las drogas para la quimioterapia se administran durante el [place].   Categora X - sustancias que han demostrado causar defectos congnitos. Nunca deben tomarse Academic librarian. Ellas son:   Medicamentos para tratar problemas de la piel, como el acne qustico (Accutane) y la psoriasis (Tegison or Soriatane).   Talidomida   Dietilestilbestrol o DES.  Ningn medicamento es considerado 100% seguro para ser Civil Service fast streamer, debido a que cada persona reacciona de Scientist, water quality. No se recomienda Academic librarian el consumo de aspirina y otros medicamentos que contengan salicilato, especialmente durante los ltimos tres [place], debido a que pueden Tour manager. En algunos casos raros, el mdico podr indicar estos medicamentos, bajo estrecha vigilancia. El acetilsalicilato es un ingrediente comn en muchos analgsicos de [place], y puede hacer que un embarazo dure ms. Puede causar hemorragias graves antes y despus del parto.  DEBO EVITAR TOMAR CUALQUIER MEDICAMENTO DURANTE EL EMBARAZO?  Si debe continuar o no tomando sus medicamentes durante el embarazo es una cuestin seria. Sin embargo, si suspende el uso de los medicamentos que [place], podra daarse usted y el beb. Converse con su mdico para saber si los beneficios son [employer] para usted y su beb.  Las mujeres embarazadas y las que amamantan que necesitan medicamentos para enfermedades psiquitricas deben consultar su obstetra, con el pediatra y con el profesional de la salud mental antes de Database administrator. MEDICAMENTOS NATURALES O HIERBAS MEDICINALES DURANTE EL EMBARAZO Aunque se cree que algunas que medicinales pueden beneficiar durante el embarazo, no existen estudios que prueben esta afirmacin. Del mismo modo, existen muy pocos estudios sobre la seguridad y Engineer, agricultural efectividad de las hierbas medicinales durante el [place]. Nunca use hierbas medicinales sin consultar a su mdico. Estos productos pueden contener agentes que le traigan problemas a usted y al feto en desarrollo. Pueden tambin causar problemas al [employer].  Si cree que su madre recibi dietilestilbesterol (DES), un estrgeno sinttico, cuando estaba embarazada de usted, comunquelo a su mdico. Pregntele:  Qu tipo de anlisis necesita.   Con qu frecuencia debe realizarlos.   Todo lo necesario para asegurarse que no tendr ningn problema.  Una mujer cuya madre recibi DES durante el embarazo debe ser controlada y evaluada para detectar anormalidades en sus rganos femeninos durante toda su vida. MEDICAMENTOS DE VENTA LIBRE QUE SON SEGUROS PARA TOMAR DURANTE EL EMBARAZO: Todo medicamento que se reciba Academic librarian debe ser siempre autorizado por el mdico.  Alergias: (Benadryl).   Resfros y gripe: Tylenol (acetaminofeno). Tynenol Cold, grgaras de agua tibia con sal, gotas nasales de solucin salina.   Constipacin: (Metamucil, Citrucil, Fiberall/Fobricon, Colace, Leche de [place] , [place]).   Diarrea (Kaopectate, Immodium, Parepectolin). No debe tomarlos en el primer trimestre del embarazo y slo durante 24 horas. Comunquese con su mdico si an tiene diarrea.   Dolor de cabeza: (Tylenol).   Ungentos para cortes y raspaduras: (  J & J, Bacitracin, Neosporin).   Acidez: (Tums, Riopan, titralacT, Gaviscon).   Hemorroides: (Preparation H, anusol, tucks, Witch hazel).   Nuseas y vmitos (Vitamina B6 comprimidos de 100mg , emetrol si no es diabtica, Emetrex, Sea Bands).    Erupciones (hidrocortisona en crema o ungento, caladryl en locin o crema, benadryl en crema o cpsulas, baos de avena).   Infecciones en la vagina por hongos (Monistat crema o comprimidos, Terazol crema).  PARA OBTENER MS INFORMACIN Para obtener ms informacin relacionada con los medicamentos que toma y como pueden afectar su embarazo, visite la pgina web: http://www.drugs.com/drug_information.html *Parte de esta informacin se basa en estudiso realizados por The Education officer, environmental (FDA). Document Released: 12/08/2007 Document Revised: 03/03/2011 [facility] Patient Information 2012 [place], [place].

## 2011-06-03 NOTE — Progress Notes (Signed)
U/S scheduled Dec. 13,2012 at 2pm.

## 2011-06-03 NOTE — Progress Notes (Signed)
P=106, Used Interpreter Delorise Royals,

## 2011-06-10 ENCOUNTER — Ambulatory Visit (INDEPENDENT_AMBULATORY_CARE_PROVIDER_SITE_OTHER): Payer: Self-pay | Admitting: *Deleted

## 2011-06-10 VITALS — BP 92/70 | HR 92 | Temp 97.5°F

## 2011-06-10 DIAGNOSIS — O09219 Supervision of pregnancy with history of pre-term labor, unspecified trimester: Secondary | ICD-10-CM

## 2011-06-17 ENCOUNTER — Ambulatory Visit (INDEPENDENT_AMBULATORY_CARE_PROVIDER_SITE_OTHER): Payer: Self-pay | Admitting: Obstetrics and Gynecology

## 2011-06-17 ENCOUNTER — Ambulatory Visit (HOSPITAL_COMMUNITY)
Admission: RE | Admit: 2011-06-17 | Discharge: 2011-06-17 | Disposition: A | Discharge status: Home / Self Care | Payer: Self-pay | Source: Ambulatory Visit | Attending: Physician Assistant | Admitting: Physician Assistant

## 2011-06-17 DIAGNOSIS — Z9889 Other specified postprocedural states: Secondary | ICD-10-CM

## 2011-06-17 DIAGNOSIS — O09219 Supervision of pregnancy with history of pre-term labor, unspecified trimester: Secondary | ICD-10-CM

## 2011-06-17 DIAGNOSIS — Z363 Encounter for antenatal screening for malformations: Secondary | ICD-10-CM | POA: Insufficient documentation

## 2011-06-17 DIAGNOSIS — O2390 Unspecified genitourinary tract infection in pregnancy, unspecified trimester: Secondary | ICD-10-CM

## 2011-06-17 DIAGNOSIS — B951 Streptococcus, group B, as the cause of diseases classified elsewhere: Secondary | ICD-10-CM

## 2011-06-17 DIAGNOSIS — O0990 Supervision of high risk pregnancy, unspecified, unspecified trimester: Secondary | ICD-10-CM | POA: Insufficient documentation

## 2011-06-17 DIAGNOSIS — O09299 Supervision of pregnancy with other poor reproductive or obstetric history, unspecified trimester: Secondary | ICD-10-CM

## 2011-06-17 DIAGNOSIS — N390 Urinary tract infection, site not specified: Secondary | ICD-10-CM

## 2011-06-17 DIAGNOSIS — O358XX0 Maternal care for other (suspected) fetal abnormality and damage, not applicable or unspecified: Secondary | ICD-10-CM | POA: Insufficient documentation

## 2011-06-17 DIAGNOSIS — Z98891 History of uterine scar from previous surgery: Secondary | ICD-10-CM

## 2011-06-17 DIAGNOSIS — Z1389 Encounter for screening for other disorder: Secondary | ICD-10-CM | POA: Insufficient documentation

## 2011-06-17 LAB — POCT URINALYSIS DIP (DEVICE)
Nitrite: NEGATIVE
Protein, ur: 30 mg/dL — AB
Urobilinogen, UA: 0.2 mg/dL (ref 0.0–1.0)
pH: 6 (ref 5.0–8.0)

## 2011-06-17 NOTE — Progress Notes (Signed)
Patient doing well without complaints. F/U anatomy ultrasound today. Patient declined quad screen. Continue weekly 17-OHP

## 2011-06-17 NOTE — Progress Notes (Signed)
Used Interpreter  

## 2011-06-22 DIAGNOSIS — O2340 Unspecified infection of urinary tract in pregnancy, unspecified trimester: Secondary | ICD-10-CM | POA: Insufficient documentation

## 2011-06-22 MED ORDER — CEPHALEXIN 500 MG PO CAPS: 500.0000 mg | ORAL_CAPSULE | Freq: Three times a day (TID) | ORAL | Status: AC

## 2011-06-22 NOTE — Progress Notes (Signed)
Addended by: Catalina Antigua on: 06/22/2011 01:50 PM   Modules accepted: Orders

## 2011-06-23 ENCOUNTER — Telehealth: Payer: Self-pay | Admitting: *Deleted

## 2011-06-23 NOTE — Telephone Encounter (Signed)
Message copied by Mannie Stabile on Wed Jun 23, 2011  1:31 PM ------      Message from: Catalina Antigua      Created: Tue Jun 22, 2011  1:51 PM       Please inform patient of positive GBS UTI and that a prescription was e-prescribed             Gigi Gin

## 2011-06-23 NOTE — Telephone Encounter (Signed)
Called patient using pacific interpreters [phone]. Pt did not answer interpreter left her a message to call us back.

## 2011-06-24 ENCOUNTER — Ambulatory Visit (INDEPENDENT_AMBULATORY_CARE_PROVIDER_SITE_OTHER): Payer: Self-pay | Admitting: *Deleted

## 2011-06-24 VITALS — BP 110/71 | HR 101 | Temp 98.3°F

## 2011-06-24 DIAGNOSIS — O09219 Supervision of pregnancy with history of pre-term labor, unspecified trimester: Secondary | ICD-10-CM

## 2011-06-24 DIAGNOSIS — O09899 Supervision of other high risk pregnancies, unspecified trimester: Secondary | ICD-10-CM

## 2011-06-24 NOTE — Telephone Encounter (Signed)
Pt notified via Ashley Garza Spanish interpreter.l

## 2011-07-01 ENCOUNTER — Ambulatory Visit (INDEPENDENT_AMBULATORY_CARE_PROVIDER_SITE_OTHER): Payer: Self-pay | Admitting: Obstetrics & Gynecology

## 2011-07-01 DIAGNOSIS — N390 Urinary tract infection, site not specified: Secondary | ICD-10-CM

## 2011-07-01 DIAGNOSIS — B951 Streptococcus, group B, as the cause of diseases classified elsewhere: Secondary | ICD-10-CM

## 2011-07-01 DIAGNOSIS — O2340 Unspecified infection of urinary tract in pregnancy, unspecified trimester: Secondary | ICD-10-CM

## 2011-07-01 DIAGNOSIS — O2390 Unspecified genitourinary tract infection in pregnancy, unspecified trimester: Secondary | ICD-10-CM

## 2011-07-01 DIAGNOSIS — O09219 Supervision of pregnancy with history of pre-term labor, unspecified trimester: Secondary | ICD-10-CM

## 2011-07-01 DIAGNOSIS — O0990 Supervision of high risk pregnancy, unspecified, unspecified trimester: Secondary | ICD-10-CM

## 2011-07-01 LAB — POCT URINALYSIS DIP (DEVICE)
Glucose, UA: 100 mg/dL — AB
Hgb urine dipstick: NEGATIVE
Nitrite: NEGATIVE
Protein, ur: 30 mg/dL — AB
Specific Gravity, Urine: >=1.03 (ref 1.005–1.030)
Urobilinogen, UA: 0.2 mg/dL (ref 0.0–1.0)
pH: 6.5 (ref 5.0–8.0)

## 2011-07-01 NOTE — Progress Notes (Signed)
No other complaints or concerns.  Fetal movement and labor precautions reviewed.  Continue weekly 17P.  Normal anatomy scan, declines quad screen.

## 2011-07-01 NOTE — Progress Notes (Signed)
Used Interpreter [employer]. Pt feeling very anxious, also having hot flashes.

## 2011-07-01 NOTE — Patient Instructions (Signed)
Lavado de manos Careers information officer) Mantener la salud es importante tanto para usted como para toda su familia. Siga estos simples y econmicos consejos para ayudar a prevenir muchas enfermedades infecciosas. CMO LAVARSE  Moje sus manos y aplique jabn lquido, en barra o en polvo.   Frtese las Reynolds American s vigorosamente para que se forme espuma, y refriegue todas las superficies. Asegrese de [employer]ner dedos y alrededor de las uas.   Contine durante 20 segundos! Se necesita esa cantidad de tiempo para que el jabn y la accin de frotacin logren desprender y remover algunos grmenes "testarudos".   Enjuguese bien las manos bajo el agua.   Squelas utilizando toallas de papel o Neomia Dear secadora de [place].   De ser posible, utilice la toalla de papel para cerrar el grifo.  CUNDO LAVARSE LAS MANOS  Antes de comer.   Antes, durante, y luego de Community education officer o preparar alimentos.   Luego de entrar en contacto con sangre o fludos corporales (como vmito, secreciones nasales, o saliva). Esto significa que debe lavarse despus de limpiarse la nariz.   Luego de cambiar paales.   Luego de DIRECTV.   Luego de tocar animales o sus juguetes, correa o deshechos.   Luego de tocar algo que pudo estar contaminado (como el bote de basura, pao de limpieza, el desage o [place]).   Antes de colocar un vendaje, dar medicamentos o colocar lentes de contacto.   Hgalo ms a menudo cuando haya alguien enfermo en su casa.   Siempre que sus manos se vean sucias.  Si no tiene [place] y [place], use un limpiador de manos con alcohol. Mantener las manos limpias es una de las mejores formas de Automotive engineer enfermarse y Sales executive. Limpiando sus manos eliminar los grmenes que obtuvo:  Firefighter.   De las superficies que toca.   De los [employer] Corporation con los que TEFL teacher.  Document Released: 12/08/2007 Document Revised: 03/03/2011 [facility] Patient Information 2012  [place], [place].

## 2011-07-08 ENCOUNTER — Ambulatory Visit (INDEPENDENT_AMBULATORY_CARE_PROVIDER_SITE_OTHER): Payer: Self-pay

## 2011-07-08 VITALS — BP 110/69 | HR 100

## 2011-07-08 DIAGNOSIS — O09219 Supervision of pregnancy with history of pre-term labor, unspecified trimester: Secondary | ICD-10-CM

## 2011-07-15 ENCOUNTER — Ambulatory Visit (INDEPENDENT_AMBULATORY_CARE_PROVIDER_SITE_OTHER): Payer: Self-pay | Admitting: Physician Assistant

## 2011-07-15 VITALS — BP 105/69 | Temp 98.2°F | Wt 159.0 lb

## 2011-07-15 DIAGNOSIS — O09219 Supervision of pregnancy with history of pre-term labor, unspecified trimester: Secondary | ICD-10-CM

## 2011-07-15 LAB — POCT URINALYSIS DIP (DEVICE)
Nitrite: NEGATIVE
Protein, ur: 100 mg/dL — AB
pH: 6 (ref 5.0–8.0)

## 2011-07-15 NOTE — Progress Notes (Signed)
No complaints. +FM, No PTL s/s. Will reculture urine today for TOC. If +C&S again, will start nightly suppression. Continue 17-P. Quad screen not collect, NL anatomy scan at 18 weeks.

## 2011-07-15 NOTE — Progress Notes (Signed)
Pulse: 99 

## 2011-07-15 NOTE — Patient Instructions (Signed)
Parto prematuro  (Preterm Labor) El parto prematuro comienza antes de la semana 37 de embarazo. La duracin de un embarazo normal es de 39 a 41 semanas.  CAUSAS  Generalmente no hay una causa que pueda identificarse. Sin embargo, una de las causas conocidas ms frecuentes son las infecciones. Las infecciones del tero, el cuello, la vagina, el lquido amnitico, la vejiga, los riones y hasta de los pulmones (neumona) pueden hacer que el trabajo de parto se inicie. Otras causas son:   Infecciones urogenitales, como infecciones por hongos y vaginosis bacteriana.   Anormalidades uterinas (forma del tero, sptum uterino, fibromas, hemorragias en la placenta).   Un cuello que ha sido operado y se abre prematuramente.   Malformaciones del beb.   Gestaciones mltiples (mellizos, trillizos y ms).   Ruptura del saco amnitico.  :Otros factores de riesgo del parto prematuro son   Historia previa de parto prematuro.   Ruptura prematura de las membranas.   La placenta cubre la apertura del cuello (placenta previa).   La placenta se separa del tero (abrupcin placentaria).   El cuello es demasiado dbil para contener al beb en el tero (cuello incompetente).   Hay mucho lquido en el saco amnitico (polihidramnios).   Consumo de drogas o hbito de fumar durante el embarazo.   No aumentar de peso lo suficiente durante el embarazo.   Mujeres menores de 18 aos o mayores de 35 aos aos.   Nivel socioeconmico bajo.   Raza afroamericana.  SNTOMAS  Los signos y sntomas son:   Clicos del tipo menstrual   Contracciones con un intervalo entre 30 y 70 segundos, comienzan a ser regulares, se hacen ms frecuentes y se hacen ms intensas y dolorosas.   Contracciones que comienzan en la parte superior del tero y se expanden hacia abajo, hacia la zona inferior del abdomen y la espalda.   Sensacin de presin en la pelvis o dolor en la espalda.   Aparece una secrecin acuosa o  sanguinolenta por la vagina.  DIAGNSTICO  El diagnstico puede confirmarse:   Con un examen vaginal.   Ecografa del cuello.   Muestra (hisopado) de las secreciones crvico-vaginales. Estas muestras se analizan para buscar la presencia de fibronectina fetal. Esta protena que se encuentra en las secreciones del tero y se asocia con el parto prematuro.   Monitoreo fetal  TRATAMIENTO  Segn el tiempo del embarazo y otras circunstancias, el mdico puede indicar reposo en cama. Si es necesario, le indicarn medicamentos para detener las contracciones y apurar la maduracin de los pulmones del feto. Si el trabajo de parto se inicia antes de las 34 semanas de embarazo, se recomienda la hospitalizacin. El tratamiento depende de las condiciones en que se encuentre la madre y el beb.  PREVENCIN  Hay algunas cosas que una madre puede hacer para disminuir el riesgo de trabajo de parto prematuro en futuros embarazos. Una mam puede:   Dejar de fumar.   Mantener un peso saludable y evitar sustancias qumicas y drogas innecesarias.   Controlar todo tipo de infeccin.   Informar al mdico si tiene una historia conocida de parto prematuro.  Document Released: 09/28/2007 Document Revised: 03/03/2011 [employer] Patient Information 2012 [employer]. 

## 2011-07-17 LAB — CULTURE, OB URINE

## 2011-07-22 ENCOUNTER — Ambulatory Visit (INDEPENDENT_AMBULATORY_CARE_PROVIDER_SITE_OTHER): Payer: Self-pay | Admitting: Obstetrics and Gynecology

## 2011-07-22 VITALS — BP 112/67 | HR 107

## 2011-07-22 DIAGNOSIS — O09219 Supervision of pregnancy with history of pre-term labor, unspecified trimester: Secondary | ICD-10-CM

## 2011-07-22 LAB — POCT URINALYSIS DIP (DEVICE)
Bilirubin Urine: NEGATIVE
Specific Gravity, Urine: 1.025 (ref 1.005–1.030)
pH: 6.5 (ref 5.0–8.0)

## 2011-07-22 NOTE — Progress Notes (Signed)
States fetal movement present. Spanish interpreter: Delorise Royals

## 2011-07-29 ENCOUNTER — Ambulatory Visit (INDEPENDENT_AMBULATORY_CARE_PROVIDER_SITE_OTHER): Payer: Self-pay | Admitting: Physician Assistant

## 2011-07-29 VITALS — BP 96/69 | HR 91 | Temp 97.3°F | Wt 162.6 lb

## 2011-07-29 DIAGNOSIS — O09219 Supervision of pregnancy with history of pre-term labor, unspecified trimester: Secondary | ICD-10-CM

## 2011-07-29 LAB — POCT URINALYSIS DIP (DEVICE)
Glucose, UA: 100 mg/dL — AB
Ketones, ur: NEGATIVE mg/dL
Protein, ur: 30 mg/dL — AB
Specific Gravity, Urine: >=1.03 (ref 1.005–1.030)

## 2011-07-29 NOTE — Progress Notes (Signed)
Interpreter used: Wilma. Edema trace on her right leg accompanied with pain that radiates from lower back. Vaginal d/c described as white; no itch, no odor.

## 2011-07-29 NOTE — Progress Notes (Signed)
Complaint of mid-back pain. No CVAT. MS discomfort, soft tissue tenderness on exam. Reviewed comfort measures. Discussed with pt decision for RLCS. She desires TOLAC if spontaneous labor before 39 weeks. If not labor, then planned c/s at 39.

## 2011-07-29 NOTE — Patient Instructions (Signed)
Embarazo - Segundo trimestre (Pregnancy - Second Trimester) El segundo trimestre del embarazo (del 3 al 6mes) es un perodo de evolucin rpida para usted y el beb. Hacia el final del sexto mes, el beb mide aproximadamente 23 cm y pesa 680 g. Comenzar a sentir los movimientos del beb entre las 18 y las 20 semanas de embarazo. Podr sentir las pataditas ("quickening en ingls"). Hay un rpido aumento de peso. Puede segregar un lquido claro (calostro) de las mamas. Quizs sienta pequeas contracciones en el vientre (tero) Esto se conoce como falso trabajo de parto o contracciones de Braxton-Hicks. Es como una prctica del trabajo de parto que se produce cuando el beb est listo para salir. Generalmente los problemas de vmitos matinales ya se han superado hacia el final del primer trimestre. Algunas mujeres desarrollan pequeas manchas oscuras (que se denominan cloasma, mscara del embarazo) en la cara que normalmente se van luego del nacimiento del beb. La exposicin al sol empeora las manchas. Puede desarrollarse acn en algunas mujeres embarazadas, y puede desaparecer en aquellas que ya tienen acn. EXAMENES PRENATALES  Durante los exmenes prenatales, deber seguir realizando pruebas de sangre, segn avance el embarazo. Estas pruebas se realizan para controlar su salud y la del beb. Tambin se realizan anlisis de sangre para conocer los niveles de hemoglobina. La anemia (bajo nivel de hemoglobina) es frecuente durante el embarazo. Para prevenirla, se administran hierro y vitaminas. Tambin se le realizarn exmenes para saber si tiene diabetes entre las 24 y las 28 semanas del embarazo. Podrn repetirle algunas de las pruebas que le hicieron previamente.   En cada visita le medirn el tamao del tero. Esto se realiza para asegurarse de que el beb est creciendo correctamente de acuerdo al estado del embarazo.   Tambin en cada visita prenatal controlarn su presin arterial. Esto se realiza  para asegurarse de que no tenga toxemia.   Se controlar su orina para asegurarse de que no tenga infecciones, diabetes o protena en la orina.   Se controlar su peso regularmente para asegurarse que el aumento ocurre al ritmo indicado. Esto se hace para asegurarse que usted y el beb tienen una evolucin normal.   En algunas ocasiones se realiza una prueba de ultrasonido para confirmar el correcto desarrollo y evolucin del beb. Esta prueba se realiza con ondas sonoras inofensivas para el beb, de modo que el profesional pueda calcular ms precisamente la fecha del parto.  Algunas veces se realizan pruebas especializadas del lquido amnitico que rodea al beb. Esta prueba se denomina amniocentesis. El lquido amnitico se obtiene introduciendo una aguja en el vientre (abdomen). Se realiza para controlar los cromosomas en aquellos casos en los que existe alguna preocupacin acerca de algn problema gentico que pueda sufrir el beb. En ocasiones se lleva a cabo cerca del final del embarazo, si es necesario inducir al parto. En este caso se realiza para asegurarse que los pulmones del beb estn lo suficientemente maduros como para que pueda vivir fuera del tero. CAMBIOS QUE OCURREN EN EL SEGUNDO TRIMESTRE DEL EMBARAZO Su organismo atravesar numerosos cambios durante el embarazo. Estos pueden variar de una persona a otra. Converse con el profesional que la asiste acerca los cambios que usted note y que la preocupen.  Durante el segundo trimestre probablemente sienta un aumento del apetito. Es normal tener "antojos" de ciertas comidas. Esto vara de una persona a otra y de un embarazo a otro.   El abdomen inferior comenzar a abultarse.   Podr tener la necesidad   de orinar con ms frecuencia debido a que el tero y el beb presionan sobre la vejiga. Tambin es frecuente contraer ms infecciones urinarias durante el embarazo (dolor al orinar). Puede evitarlas bebiendo gran cantidad de lquidos y  vaciando la vejiga antes y despus de mantener relaciones sexuales.   Podrn aparecer las primeras estras en las caderas, abdomen y mamas. Estos son cambios normales del cuerpo durante el embarazo. No existen medicamentos ni ejercicios que puedan prevenir estos cambios.   Es posible que comience a desarrollar venas inflamadas y abultadas (varices) en las piernas. El uso de medias de descanso, elevar sus pies durante 15 minutos, 3 a 4 veces al da y limitar la sal en su dieta ayuda a aliviar el problema.   Podr sentir acidez gstrica a medida que el tero crece y presiona contra el estmago. Puede tomar anticidos, con la autorizacin de su mdico, para aliviar este problema. Tambin es til ingerir pequeas comidas 4 a 5 veces al da.   La constipacin puede tratarse con un laxante o agregando fibra a su dieta. Beber grandes cantidades de lquidos, comer vegetales, frutas y granos integrales es de gran ayuda.   Tambin es beneficioso practicar actividad fsica. Si ha sido una persona activa hasta el embarazo, podr continuar con la mayora de las actividades durante el mismo. Si ha sido menos activa, puede ser beneficioso que comience con un programa de ejercicios, como realizar caminatas.   Puede desarrollar hemorroides (vrices en el recto) hacia el final del segundo trimestre. Tomar baos de asiento tibios y utilizar cremas recomendadas por el profesional que lo asiste sern de ayuda para los problemas de hemorroides.   Tambin podr sentir dolor de espalda durante este momento de su embarazo. Evite levantar objetos pesados, utilice zapatos de taco bajo y mantenga una buena postura para ayudar a reducir los problemas de espalda.   Algunas mujeres embarazadas desarrollan hormigueo y adormecimiento de la mano y los dedos debido a la hinchazn y compresin de los ligamentos de la mueca (sndrome del tnel carpiano). Esto desaparece una vez que el beb nace.   Como sus pechos se agrandan,  necesitar un sujetador ms grande. Use un sostn de soporte, cmodo y de algodn. No utilice un sostn para amamantar hasta el ltimo mes de embarazo si va a amamantar al beb.   Podr observar una lnea oscura desde el ombligo hacia la zona pbica denominada linea nigra.   Podr observar que sus mejillas se ponen coloradas debido al aumento de flujo sanguneo en la cara.   Podr desarrollar "araitas" en la cara, cuello y pecho. Esto desaparece una vez que el beb nace.  INSTRUCCIONES PARA EL CUIDADO DOMICILIARIO  Es extremadamente importante que evite el cigarrillo, hierbas medicinales, alcohol y las drogas no prescriptas durante el embarazo. Estas sustancias qumicas afectan la formacin y el desarrollo del beb. Evite estas sustancias durante todo el embarazo para asegurar el nacimiento de un beb sano.   La mayor parte de los cuidados que se aconsejan son los mismos que los indicados para el primer trimestre del embarazo. Cumpla con las citas tal como se le indic. Siga las instrucciones del profesional que lo asiste con respecto al uso de los medicamentos, el ejercicio y la dieta.   Durante el embarazo debe obtener nutrientes para usted y para su beb. Consuma alimentos balanceados a intervalos regulares. Elija alimentos como carne, pescado, leche y otros productos lcteos descremados, vegetales, frutas, panes integrales y cereales. El profesional le informar cul es el   aumento de peso ideal.   Las relaciones sexuales fsicas pueden continuarse hasta cerca del fin del embarazo si no existen otros problemas. Estos problemas pueden ser la prdida temprana (prematura) de lquido amnitico de las membranas, sangrado vaginal, dolor abdominal u otros problemas mdicos o del embarazo.   Realice actividad fsica todos los das, si no tiene restricciones. Consulte con el profesional que la asiste si no sabe con certeza si determinados ejercicios son seguros. El mayor aumento de peso tiene lugar  durante los ltimos 2 trimestres del embarazo. El ejercicio la ayudar a:   Controlar su peso.   Ponerla en forma para el parto.   Ayudarla a perder peso luego de haber dado a luz.   Use un buen sostn o como los que se usan para hacer deportes para aliviar la sensibilidad de las mamas. Tambin puede serle til si lo usa mientras duerme. Si pierde calostro, podr utilizar apsitos en el sostn.   No utilice la baera con agua caliente, baos turcos y saunas durante el embarazo.   Utilice el cinturn de seguridad sin excepcin cuando conduzca. Este la proteger a usted y al beb en caso de accidente.   Evite comer carne cruda, queso crudo, y el contacto con los utensilios y desperdicios de los gatos. Estos elementos contienen grmenes que pueden causar defectos de nacimiento en el beb.   El segundo trimestre es un buen momento para visitar a su dentista y evaluar su salud dental si an no lo ha hecho. Es importante mantener los dientes limpios. Utilice un cepillo de dientes blando. Cepllese ms suavemente durante el embarazo.   Es ms fcil perder algo de orina durante el embarazo. Apretar y fortalecer los msculos de la pelvis la ayudar con este problema. Practique detener la miccin cuando est en el bao. Estos son los mismos msculos que necesita fortalecer. Son tambin los mismos msculos que utiliza cuando trata de evitar los gases. Puede practicar apretando estos msculos 10 veces, y repetir esto 3 veces por da aproximadamente. Una vez que conozca qu msculos debe apretar, no realice estos ejercicios durante la miccin. Puede favorecerle una infeccin si la orina vuelve hacia atrs.   Pida ayuda si tiene necesidades econmicas, de asesoramiento o nutricionales durante el embarazo. El profesional podr ayudarla con respecto a estas necesidades, o derivarla a otros especialistas.   La piel puede ponerse grasa. Si esto sucede, lvese la cara con un jabn suave, utilice un humectante no  graso y maquillaje con base de aceite o crema.  CONSUMO DE MEDICAMENTOS Y DROGAS DURANTE EL EMBARAZO  Contine tomando las vitaminas apropiadas para esta etapa tal como se le indic. Las vitaminas deben contener un miligramo de cido flico y deben suplementarse con hierro. Guarde todas las vitaminas fuera del alcance de los nios. La ingestin de slo un par de vitaminas o tabletas que contengan hierro puede ocasionar la muerte en un beb o en un nio pequeo.   Evite el uso de medicamentos, inclusive los de venta libre y hierbas que no hayan sido prescriptos o indicados por el profesional que la asiste. Algunos medicamentos pueden causar problemas fsicos al beb. Utilice los medicamentos de venta libre o de prescripcin para el dolor, el malestar o la fiebre, segn se lo indique el profesional que lo asiste. No utilice aspirina.   El consumo de alcohol est relacionado con ciertos defectos de nacimiento. Esto incluye el sndrome de alcoholismo fetal. Debe evitar el consumo de alcohol en cualquiera de sus formas. El cigarrillo   causa nacimientos prematuros y bebs de bajo peso. El uso de drogas recreativas est absolutamente prohibido. Son muy nocivas para el beb. Un beb que nace de una madre adicta, ser adicto al nacer. Ese beb tendr los mismos sntomas de abstinencia que un adulto.   Infrmele al profesional si consume alguna droga.   No consuma drogas ilegales. Pueden causarle mucho dao al beb.  SOLICITE ATENCIN MDICA SI: Tiene preguntas o preocupaciones durante su embarazo. Es mejor que llame para consultar las dudas que esperar hasta su prxima visita prenatal. De esta forma se sentir ms tranquila.  SOLICITE ATENCIN MDICA DE INMEDIATO SI:  La temperatura oral se eleva sin motivo por encima de 102 F (38.9 C) o segn le indique el profesional que lo asiste.   Tiene una prdida de lquido por la vagina (canal de parto). Si sospecha una ruptura de las membranas, tmese la  temperatura y llame al profesional para informarlo sobre esto.   Observa unas pequeas manchas, una hemorragia vaginal o elimina cogulos. Notifique al profesional acerca de la cantidad y de cuntos apsitos est utilizando. Unas pequeas manchas de sangre son algo comn durante el embarazo, especialmente despus de mantener relaciones sexuales.   Presenta un olor desagradable en la secrecin vaginal y observa un cambio en el color, de transparente a blanco.   Contina con las nuseas y no obtiene alivio de los remedios indicados. Vomita sangre o algo similar a la borra del caf.   [place] o sube ms de 900 g. en una semana, o segn lo indicado por el profesional que la asiste.   Observa que se le hinchan el rostro, las manos, los pies o las piernas.   Ha estado expuesta a la rubola y no ha sufrido la enfermedad.   Ha estado expuesta a la quinta enfermedad o a la varicela.   Presenta dolor abdominal. Las molestias en el ligamento redondo son una causa no cancerosa (benigna) frecuente de dolor abdominal durante el embarazo. El profesional que la asiste deber evaluarla.   Presenta dolor de cabeza intenso que no se alivia.   Presenta fiebre, diarrea, dolor al orinar o le falta la respiracin.   Presenta dificultad para ver, visin borrosa, o visin doble.   Sufre una cada, un accidente de trnsito o cualquier tipo de trauma.   Vive en un hogar en el que existe violencia fsica o mental.  Document Released: 03/31/2005 Document Revised: 03/03/2011 [employer] Patient Information 2012 [employer]. 

## 2011-07-31 LAB — CULTURE, OB URINE

## 2011-08-05 ENCOUNTER — Ambulatory Visit (INDEPENDENT_AMBULATORY_CARE_PROVIDER_SITE_OTHER): Payer: Self-pay | Admitting: *Deleted

## 2011-08-05 DIAGNOSIS — O09219 Supervision of pregnancy with history of pre-term labor, unspecified trimester: Secondary | ICD-10-CM

## 2011-08-05 DIAGNOSIS — O09899 Supervision of other high risk pregnancies, unspecified trimester: Secondary | ICD-10-CM

## 2011-08-12 ENCOUNTER — Ambulatory Visit (INDEPENDENT_AMBULATORY_CARE_PROVIDER_SITE_OTHER): Payer: Self-pay | Admitting: Obstetrics & Gynecology

## 2011-08-12 DIAGNOSIS — O2390 Unspecified genitourinary tract infection in pregnancy, unspecified trimester: Secondary | ICD-10-CM

## 2011-08-12 DIAGNOSIS — O09899 Supervision of other high risk pregnancies, unspecified trimester: Secondary | ICD-10-CM

## 2011-08-12 DIAGNOSIS — N390 Urinary tract infection, site not specified: Secondary | ICD-10-CM

## 2011-08-12 DIAGNOSIS — O09219 Supervision of pregnancy with history of pre-term labor, unspecified trimester: Secondary | ICD-10-CM

## 2011-08-12 DIAGNOSIS — B951 Streptococcus, group B, as the cause of diseases classified elsewhere: Secondary | ICD-10-CM

## 2011-08-12 LAB — POCT URINALYSIS DIP (DEVICE)
Protein, ur: 100 mg/dL — AB
Specific Gravity, Urine: 1.02 (ref 1.005–1.030)

## 2011-08-12 NOTE — Progress Notes (Signed)
Has low back pain.

## 2011-08-12 NOTE — Progress Notes (Signed)
Schedule [place] 28 weeks.

## 2011-08-12 NOTE — Patient Instructions (Signed)
Parto prematuro  (Preterm Labor) El parto prematuro comienza antes de la semana 37 de embarazo. La duracin de un embarazo normal es de 39 a 41 semanas.  CAUSAS  Generalmente no hay una causa que pueda identificarse. Sin embargo, una de las causas conocidas ms frecuentes son las infecciones. Las infecciones del tero, el cuello, la vagina, el lquido amnitico, la vejiga, los riones y hasta de los pulmones (neumona) pueden hacer que el trabajo de parto se inicie. Otras causas son:   Infecciones urogenitales, como infecciones por hongos y vaginosis bacteriana.   Anormalidades uterinas (forma del tero, sptum uterino, fibromas, hemorragias en la placenta).   Un cuello que ha sido operado y se abre prematuramente.   Malformaciones del beb.   Gestaciones mltiples (mellizos, trillizos y ms).   Ruptura del saco amnitico.  :Otros factores de riesgo del parto prematuro son   Historia previa de parto prematuro.   Ruptura prematura de las membranas.   La placenta cubre la apertura del cuello (placenta previa).   La placenta se separa del tero (abrupcin placentaria).   El cuello es demasiado dbil para contener al beb en el tero (cuello incompetente).   Hay mucho lquido en el saco amnitico (polihidramnios).   Consumo de drogas o hbito de fumar durante el embarazo.   No aumentar de peso lo suficiente durante el embarazo.   Mujeres menores de 18 aos o mayores de 35 aos aos.   Nivel socioeconmico bajo.   Raza afroamericana.  SNTOMAS  Los signos y sntomas son:   Clicos del tipo menstrual   Contracciones con un intervalo entre 30 y 70 segundos, comienzan a ser regulares, se hacen ms frecuentes y se hacen ms intensas y dolorosas.   Contracciones que comienzan en la parte superior del tero y se expanden hacia abajo, hacia la zona inferior del abdomen y la espalda.   Sensacin de presin en la pelvis o dolor en la espalda.   Aparece una secrecin acuosa o  sanguinolenta por la vagina.  DIAGNSTICO  El diagnstico puede confirmarse:   Con un examen vaginal.   Ecografa del cuello.   Muestra (hisopado) de las secreciones crvico-vaginales. Estas muestras se analizan para buscar la presencia de fibronectina fetal. Esta protena que se encuentra en las secreciones del tero y se asocia con el parto prematuro.   Monitoreo fetal  TRATAMIENTO  Segn el tiempo del embarazo y otras circunstancias, el mdico puede indicar reposo en cama. Si es necesario, le indicarn medicamentos para detener las contracciones y apurar la maduracin de los pulmones del feto. Si el trabajo de parto se inicia antes de las 34 semanas de embarazo, se recomienda la hospitalizacin. El tratamiento depende de las condiciones en que se encuentre la madre y el beb.  PREVENCIN  Hay algunas cosas que una madre puede hacer para disminuir el riesgo de trabajo de parto prematuro en futuros embarazos. Una mam puede:   Dejar de fumar.   Mantener un peso saludable y evitar sustancias qumicas y drogas innecesarias.   Controlar todo tipo de infeccin.   Informar al mdico si tiene una historia conocida de parto prematuro.  Document Released: 09/28/2007 Document Revised: 03/03/2011 [employer] Patient Information 2012 [employer]. 

## 2011-08-12 NOTE — Progress Notes (Signed)
U/S scheduled 08/30/11 at 2 pm.

## 2011-08-19 ENCOUNTER — Ambulatory Visit (INDEPENDENT_AMBULATORY_CARE_PROVIDER_SITE_OTHER): Payer: Self-pay | Admitting: *Deleted

## 2011-08-19 DIAGNOSIS — O09219 Supervision of pregnancy with history of pre-term labor, unspecified trimester: Secondary | ICD-10-CM

## 2011-08-26 ENCOUNTER — Encounter: Payer: Self-pay | Admitting: Physician Assistant

## 2011-08-26 ENCOUNTER — Ambulatory Visit (INDEPENDENT_AMBULATORY_CARE_PROVIDER_SITE_OTHER): Payer: Self-pay | Admitting: Physician Assistant

## 2011-08-26 DIAGNOSIS — O2390 Unspecified genitourinary tract infection in pregnancy, unspecified trimester: Secondary | ICD-10-CM

## 2011-08-26 DIAGNOSIS — N390 Urinary tract infection, site not specified: Secondary | ICD-10-CM

## 2011-08-26 DIAGNOSIS — B951 Streptococcus, group B, as the cause of diseases classified elsewhere: Secondary | ICD-10-CM

## 2011-08-26 DIAGNOSIS — O09299 Supervision of pregnancy with other poor reproductive or obstetric history, unspecified trimester: Secondary | ICD-10-CM

## 2011-08-26 LAB — POCT URINALYSIS DIP (DEVICE)
Bilirubin Urine: NEGATIVE
Glucose, UA: 250 mg/dL — AB
Specific Gravity, Urine: >=1.03 (ref 1.005–1.030)
Urobilinogen, UA: 0.2 mg/dL (ref 0.0–1.0)

## 2011-08-26 NOTE — Patient Instructions (Signed)
Parto prematuro  (Preterm Labor) El parto prematuro comienza antes de la semana 37 de embarazo. La duracin de un embarazo normal es de 39 a 41 semanas.  CAUSAS  Generalmente no hay una causa que pueda identificarse. Sin embargo, una de las causas conocidas ms frecuentes son las infecciones. Las infecciones del tero, el cuello, la vagina, el lquido amnitico, la vejiga, los riones y hasta de los pulmones (neumona) pueden hacer que el trabajo de parto se inicie. Otras causas son:   Infecciones urogenitales, como infecciones por hongos y vaginosis bacteriana.   Anormalidades uterinas (forma del tero, sptum uterino, fibromas, hemorragias en la placenta).   Un cuello que ha sido operado y se abre prematuramente.   Malformaciones del beb.   Gestaciones mltiples (mellizos, trillizos y ms).   Ruptura del saco amnitico.  :Otros factores de riesgo del parto prematuro son   Historia previa de parto prematuro.   Ruptura prematura de las membranas.   La placenta cubre la apertura del cuello (placenta previa).   La placenta se separa del tero (abrupcin placentaria).   El cuello es demasiado dbil para contener al beb en el tero (cuello incompetente).   Hay mucho lquido en el saco amnitico (polihidramnios).   Consumo de drogas o hbito de fumar durante el embarazo.   No aumentar de peso lo suficiente durante el embarazo.   Mujeres menores de 18 aos o mayores de 35 aos aos.   Nivel socioeconmico bajo.   Raza afroamericana.  SNTOMAS  Los signos y sntomas son:   Clicos del tipo menstrual   Contracciones con un intervalo entre 30 y 70 segundos, comienzan a ser regulares, se hacen ms frecuentes y se hacen ms intensas y dolorosas.   Contracciones que comienzan en la parte superior del tero y se expanden hacia abajo, hacia la zona inferior del abdomen y la espalda.   Sensacin de presin en la pelvis o dolor en la espalda.   Aparece una secrecin acuosa o  sanguinolenta por la vagina.  DIAGNSTICO  El diagnstico puede confirmarse:   Con un examen vaginal.   Ecografa del cuello.   Muestra (hisopado) de las secreciones crvico-vaginales. Estas muestras se analizan para buscar la presencia de fibronectina fetal. Esta protena que se encuentra en las secreciones del tero y se asocia con el parto prematuro.   Monitoreo fetal  TRATAMIENTO  Segn el tiempo del embarazo y otras circunstancias, el mdico puede indicar reposo en cama. Si es necesario, le indicarn medicamentos para detener las contracciones y apurar la maduracin de los pulmones del feto. Si el trabajo de parto se inicia antes de las 34 semanas de embarazo, se recomienda la hospitalizacin. El tratamiento depende de las condiciones en que se encuentre la madre y el beb.  PREVENCIN  Hay algunas cosas que una madre puede hacer para disminuir el riesgo de trabajo de parto prematuro en futuros embarazos. Una mam puede:   Dejar de fumar.   Mantener un peso saludable y evitar sustancias qumicas y drogas innecesarias.   Controlar todo tipo de infeccin.   Informar al mdico si tiene una historia conocida de parto prematuro.  Document Released: 09/28/2007 Document Revised: 03/03/2011 [employer] Patient Information 2012 [employer]. 

## 2011-08-26 NOTE — Progress Notes (Signed)
+   FM, No PTL s/s. 17-p today. Unable to stay for Glucola today. Will obtain at next Thursday visit. Declines TOLAC, desires repeat LTCS at 39 weeks, will scheduled at 36 week visit

## 2011-08-26 NOTE — Progress Notes (Signed)
Interpreter: Raynelle Fanning.   C/o middle back pain. Refused 1 hour Glucola today. States would like to do the test next week.

## 2011-08-30 ENCOUNTER — Ambulatory Visit (HOSPITAL_COMMUNITY)
Admission: RE | Admit: 2011-08-30 | Discharge: 2011-08-30 | Disposition: A | Discharge status: Home / Self Care | Payer: Self-pay | Source: Ambulatory Visit | Attending: Obstetrics & Gynecology | Admitting: Obstetrics & Gynecology

## 2011-08-30 DIAGNOSIS — Z8751 Personal history of pre-term labor: Secondary | ICD-10-CM | POA: Insufficient documentation

## 2011-08-30 DIAGNOSIS — O34219 Maternal care for unspecified type scar from previous cesarean delivery: Secondary | ICD-10-CM | POA: Insufficient documentation

## 2011-08-30 DIAGNOSIS — O09299 Supervision of pregnancy with other poor reproductive or obstetric history, unspecified trimester: Secondary | ICD-10-CM | POA: Insufficient documentation

## 2011-09-02 ENCOUNTER — Ambulatory Visit (INDEPENDENT_AMBULATORY_CARE_PROVIDER_SITE_OTHER): Payer: Self-pay | Admitting: *Deleted

## 2011-09-02 VITALS — BP 110/76

## 2011-09-02 DIAGNOSIS — O09219 Supervision of pregnancy with history of pre-term labor, unspecified trimester: Secondary | ICD-10-CM

## 2011-09-02 DIAGNOSIS — R809 Proteinuria, unspecified: Secondary | ICD-10-CM

## 2011-09-02 LAB — POCT URINALYSIS DIP (DEVICE)
Hgb urine dipstick: NEGATIVE
Protein, ur: 100 mg/dL — AB
Specific Gravity, Urine: >=1.03 (ref 1.005–1.030)
Urobilinogen, UA: 1 mg/dL (ref 0.0–1.0)
pH: 6.5 (ref 5.0–8.0)

## 2011-09-02 NOTE — Progress Notes (Signed)
Addended by: Sherre Lain A on: 09/02/2011 10:50 AM   Modules accepted: Orders

## 2011-09-03 LAB — CBC
HCT: 32.6 % — ABNORMAL LOW (ref 36.0–46.0)
MCH: 26.8 pg (ref 26.0–34.0)
MCHC: 31.6 g/dL (ref 30.0–36.0)
MCV: 84.9 fL (ref 78.0–100.0)
RDW: 13.7 % (ref 11.5–15.5)

## 2011-09-04 LAB — CULTURE, OB URINE

## 2011-09-09 ENCOUNTER — Ambulatory Visit (INDEPENDENT_AMBULATORY_CARE_PROVIDER_SITE_OTHER): Payer: Self-pay | Admitting: Physician Assistant

## 2011-09-09 DIAGNOSIS — O09299 Supervision of pregnancy with other poor reproductive or obstetric history, unspecified trimester: Secondary | ICD-10-CM

## 2011-09-09 DIAGNOSIS — Z98891 History of uterine scar from previous surgery: Secondary | ICD-10-CM

## 2011-09-09 DIAGNOSIS — O34219 Maternal care for unspecified type scar from previous cesarean delivery: Secondary | ICD-10-CM

## 2011-09-09 DIAGNOSIS — O09219 Supervision of pregnancy with history of pre-term labor, unspecified trimester: Secondary | ICD-10-CM

## 2011-09-09 LAB — POCT URINALYSIS DIP (DEVICE)
Bilirubin Urine: NEGATIVE
Glucose, UA: 100 mg/dL — AB
Hgb urine dipstick: NEGATIVE
Specific Gravity, Urine: 1.02 (ref 1.005–1.030)

## 2011-09-09 NOTE — Patient Instructions (Signed)
Parto prematuro  (Preterm Labor) El parto prematuro comienza antes de la semana 37 de embarazo. La duracin de un embarazo normal es de 39 a 41 semanas.  CAUSAS  Generalmente no hay una causa que pueda identificarse. Sin embargo, una de las causas conocidas ms frecuentes son las infecciones. Las infecciones del tero, el cuello, la vagina, el lquido amnitico, la vejiga, los riones y hasta de los pulmones (neumona) pueden hacer que el trabajo de parto se inicie. Otras causas son:   Infecciones urogenitales, como infecciones por hongos y vaginosis bacteriana.   Anormalidades uterinas (forma del tero, sptum uterino, fibromas, hemorragias en la placenta).   Un cuello que ha sido operado y se abre prematuramente.   Malformaciones del beb.   Gestaciones mltiples (mellizos, trillizos y ms).   Ruptura del saco amnitico.  :Otros factores de riesgo del parto prematuro son   Historia previa de parto prematuro.   Ruptura prematura de las membranas.   La placenta cubre la apertura del cuello (placenta previa).   La placenta se separa del tero (abrupcin placentaria).   El cuello es demasiado dbil para contener al beb en el tero (cuello incompetente).   Hay mucho lquido en el saco amnitico (polihidramnios).   Consumo de drogas o hbito de fumar durante el embarazo.   No aumentar de peso lo suficiente durante el embarazo.   Mujeres menores de 18 aos o mayores de 35 aos aos.   Nivel socioeconmico bajo.   Raza afroamericana.  SNTOMAS  Los signos y sntomas son:   Clicos del tipo menstrual   Contracciones con un intervalo entre 30 y 70 segundos, comienzan a ser regulares, se hacen ms frecuentes y se hacen ms intensas y dolorosas.   Contracciones que comienzan en la parte superior del tero y se expanden hacia abajo, hacia la zona inferior del abdomen y la espalda.   Sensacin de presin en la pelvis o dolor en la espalda.   Aparece una secrecin acuosa o  sanguinolenta por la vagina.  DIAGNSTICO  El diagnstico puede confirmarse:   Con un examen vaginal.   Ecografa del cuello.   Muestra (hisopado) de las secreciones crvico-vaginales. Estas muestras se analizan para buscar la presencia de fibronectina fetal. Esta protena que se encuentra en las secreciones del tero y se asocia con el parto prematuro.   Monitoreo fetal  TRATAMIENTO  Segn el tiempo del embarazo y otras circunstancias, el mdico puede indicar reposo en cama. Si es necesario, le indicarn medicamentos para detener las contracciones y apurar la maduracin de los pulmones del feto. Si el trabajo de parto se inicia antes de las 34 semanas de embarazo, se recomienda la hospitalizacin. El tratamiento depende de las condiciones en que se encuentre la madre y el beb.  PREVENCIN  Hay algunas cosas que una madre puede hacer para disminuir el riesgo de trabajo de parto prematuro en futuros embarazos. Una mam puede:   Dejar de fumar.   Mantener un peso saludable y evitar sustancias qumicas y drogas innecesarias.   Controlar todo tipo de infeccin.   Informar al mdico si tiene una historia conocida de parto prematuro.  Document Released: 09/28/2007 Document Revised: 06/10/2011 [employer] Patient Information 2012 [employer]. 

## 2011-09-09 NOTE — Progress Notes (Signed)
No complaints. No PTL. Reviewed NL glucola results today.

## 2011-09-09 NOTE — Progress Notes (Signed)
Pulse 111  

## 2011-09-16 ENCOUNTER — Ambulatory Visit (INDEPENDENT_AMBULATORY_CARE_PROVIDER_SITE_OTHER): Payer: Self-pay | Admitting: *Deleted

## 2011-09-16 VITALS — BP 104/69 | HR 117 | Temp 97.0°F | Ht 61.5 in | Wt 168.7 lb

## 2011-09-16 DIAGNOSIS — O09219 Supervision of pregnancy with history of pre-term labor, unspecified trimester: Secondary | ICD-10-CM

## 2011-09-16 DIAGNOSIS — O09899 Supervision of other high risk pregnancies, unspecified trimester: Secondary | ICD-10-CM

## 2011-09-23 ENCOUNTER — Ambulatory Visit (INDEPENDENT_AMBULATORY_CARE_PROVIDER_SITE_OTHER): Payer: Self-pay | Admitting: Obstetrics and Gynecology

## 2011-09-23 VITALS — BP 108/73 | HR 112 | Temp 97.2°F | Wt 166.9 lb

## 2011-09-23 DIAGNOSIS — O09219 Supervision of pregnancy with history of pre-term labor, unspecified trimester: Secondary | ICD-10-CM

## 2011-09-23 LAB — POCT URINALYSIS DIP (DEVICE)
Bilirubin Urine: NEGATIVE
Glucose, UA: 100 mg/dL — AB
Ketones, ur: NEGATIVE mg/dL
pH: 6.5 (ref 5.0–8.0)

## 2011-09-23 NOTE — Progress Notes (Signed)
Pulse = 112 

## 2011-09-23 NOTE — Patient Instructions (Signed)
Parto prematuro  (Preterm Labor) El parto prematuro comienza antes de la semana 37 de embarazo. La duracin de un embarazo normal es de 39 a 41 semanas.  CAUSAS  Generalmente no hay una causa que pueda identificarse. Sin embargo, una de las causas conocidas ms frecuentes son las infecciones. Las infecciones del tero, el cuello, la vagina, el lquido amnitico, la vejiga, los riones y hasta de los pulmones (neumona) pueden hacer que el trabajo de parto se inicie. Otras causas son:   Infecciones urogenitales, como infecciones por hongos y vaginosis bacteriana.   Anormalidades uterinas (forma del tero, sptum uterino, fibromas, hemorragias en la placenta).   Un cuello que ha sido operado y se abre prematuramente.   Malformaciones del beb.   Gestaciones mltiples (mellizos, trillizos y ms).   Ruptura del saco amnitico.  :Otros factores de riesgo del parto prematuro son   Historia previa de parto prematuro.   Ruptura prematura de las membranas.   La placenta cubre la apertura del cuello (placenta previa).   La placenta se separa del tero (abrupcin placentaria).   El cuello es demasiado dbil para contener al beb en el tero (cuello incompetente).   Hay mucho lquido en el saco amnitico (polihidramnios).   Consumo de drogas o hbito de fumar durante el embarazo.   No aumentar de peso lo suficiente durante el embarazo.   Mujeres menores de 18 aos o mayores de 35 aos aos.   Nivel socioeconmico bajo.   Raza afroamericana.  SNTOMAS  Los signos y sntomas son:   Clicos del tipo menstrual   Contracciones con un intervalo entre 30 y 70 segundos, comienzan a ser regulares, se hacen ms frecuentes y se hacen ms intensas y dolorosas.   Contracciones que comienzan en la parte superior del tero y se expanden hacia abajo, hacia la zona inferior del abdomen y la espalda.   Sensacin de presin en la pelvis o dolor en la espalda.   Aparece una secrecin acuosa o  sanguinolenta por la vagina.  DIAGNSTICO  El diagnstico puede confirmarse:   Con un examen vaginal.   Ecografa del cuello.   Muestra (hisopado) de las secreciones crvico-vaginales. Estas muestras se analizan para buscar la presencia de fibronectina fetal. Esta protena que se encuentra en las secreciones del tero y se asocia con el parto prematuro.   Monitoreo fetal  TRATAMIENTO  Segn el tiempo del embarazo y otras circunstancias, el mdico puede indicar reposo en cama. Si es necesario, le indicarn medicamentos para detener las contracciones y apurar la maduracin de los pulmones del feto. Si el trabajo de parto se inicia antes de las 34 semanas de embarazo, se recomienda la hospitalizacin. El tratamiento depende de las condiciones en que se encuentre la madre y el beb.  PREVENCIN  Hay algunas cosas que una madre puede hacer para disminuir el riesgo de trabajo de parto prematuro en futuros embarazos. Una mam puede:   Dejar de fumar.   Mantener un peso saludable y evitar sustancias qumicas y drogas innecesarias.   Controlar todo tipo de infeccin.   Informar al mdico si tiene una historia conocida de parto prematuro.  Document Released: 09/28/2007 Document Revised: 06/10/2011 [employer] Patient Information 2012 [employer]. 

## 2011-09-23 NOTE — Progress Notes (Signed)
No PTL sx. Cont 17 P 100 glucosuria -> random FS 102 Hx FGR. S=D Plans rpt C/S

## 2011-09-30 ENCOUNTER — Ambulatory Visit (INDEPENDENT_AMBULATORY_CARE_PROVIDER_SITE_OTHER): Payer: Self-pay | Admitting: *Deleted

## 2011-09-30 VITALS — BP 102/70 | HR 96 | Temp 97.3°F | Wt 169.0 lb

## 2011-09-30 DIAGNOSIS — O09219 Supervision of pregnancy with history of pre-term labor, unspecified trimester: Secondary | ICD-10-CM

## 2011-10-07 ENCOUNTER — Ambulatory Visit (INDEPENDENT_AMBULATORY_CARE_PROVIDER_SITE_OTHER): Payer: Self-pay | Admitting: Advanced Practice Midwife

## 2011-10-07 VITALS — BP 108/70 | Temp 97.7°F | Wt 168.7 lb

## 2011-10-07 DIAGNOSIS — O09219 Supervision of pregnancy with history of pre-term labor, unspecified trimester: Secondary | ICD-10-CM

## 2011-10-07 DIAGNOSIS — Z98891 History of uterine scar from previous surgery: Secondary | ICD-10-CM

## 2011-10-07 DIAGNOSIS — O09299 Supervision of pregnancy with other poor reproductive or obstetric history, unspecified trimester: Secondary | ICD-10-CM

## 2011-10-07 DIAGNOSIS — O09899 Supervision of other high risk pregnancies, unspecified trimester: Secondary | ICD-10-CM

## 2011-10-07 LAB — POCT URINALYSIS DIP (DEVICE)
Bilirubin Urine: NEGATIVE
Glucose, UA: NEGATIVE mg/dL
Ketones, ur: NEGATIVE mg/dL
Nitrite: NEGATIVE
pH: 7 (ref 5.0–8.0)

## 2011-10-07 NOTE — Progress Notes (Signed)
Growth [place] scheduled. R C/S requested for 39 weeks.

## 2011-10-07 NOTE — Progress Notes (Signed)
U/s 10/12/11 at 8 am.

## 2011-10-07 NOTE — Progress Notes (Signed)
Pulse 96. Pt states having swelling in vaginal area and very painful. No vaginal discharge.

## 2011-10-07 NOTE — Patient Instructions (Signed)
Mtodo para contar los movimientos fetales (Fetal Movement Counts) Nombre de la paciente: __________________________________________________ Ashley Garza probable de parto:____________________ En los embarazos de alto riesgo se recomienda contar las pataditas, pero tambin es una buena idea que lo hagan todas las [place]. Comience a contarlas a las 28 semanas de embarazo. Los movimientos fetales aumentan luego de una comida Immunologist o de comer o beber algo dulce (el nivel de azcar en la sangre est ms alto). Tambin es importante beber gran cantidad de lquidos (hidratarse bien) antes de contar. Si se recuesta sobre el lado izquierdo mejorar la Designer, industrial/product, o puede sentarse en una silla cmoda con los brazos sobre el abdomen y sin distracciones que la rodeen. CONTANDO  Trate de contar a la [employer] lo haga.   Marque el da y la hora y vea cunto le lleva sentir 10 movimientos (patadas, agitaciones, sacudones, vueltas). Debe sentir al menos 10 movimientos en 2 horas. Probablemente sienta los 10 movimientos en menos de dos horas. Si no los siente, espere una hora y cuente nuevamente. Luego de [employer] tendr un patrn.   Debemos observar si hay cambios en el patrn o no hay suficientes pataditas en 2 horas. Le lleva ms tiempo contar los 10 movimientos?  SOLICITE ATENCIN MDICA SI:  Siente menos de 10 pataditas en 2 horas. Intntelo dos veces.   No siente movimientos durante 1 hora.   El patrn se modifica o le lleva ms tiempo Art gallery manager las 10 pataditas.   Siente que el beb no se mueve como lo hace habitualmente.  Fecha: ____________ Movimientos: ____________ Comienzo hora: ____________ Ashley Garza: ____________ Ashley Garza: ____________ Movimientos: ____________ Comienzo hora: ____________ Ashley Garza: ____________ Ashley Garza: ____________ Movimientos: ____________ Comienzo hora: ____________ Ashley Garza: ____________ Ashley Garza: ____________ Movimientos: ____________ Comienzo hora:  ____________ Ashley Garza: ____________ Ashley Garza: ____________ Movimientos: ____________ Comienzo hora: ____________ Ashley Garza: ____________ Ashley Garza: ____________ Movimientos: ____________ Comienzo hora: ____________ Ashley Garza: ____________ Ashley Garza: ____________ Movimientos: ____________ Comienzo hora: ____________ Ashley Garza: ____________  Ashley Garza: ____________ Movimientos: ____________ Comienzo hora: ____________ Ashley Garza: ____________ Ashley Garza: ____________ Movimientos: ____________ Comienzo hora: ____________ Ashley Garza: ____________ Ashley Garza: ____________ Movimientos: ____________ Comienzo hora: ____________ Ashley Garza: ____________ Ashley Garza: ____________ Movimientos: ____________ Comienzo hora: ____________ Ashley Garza: ____________ Ashley Garza: ____________ Movimientos: ____________ Comienzo hora: ____________ Ashley Garza: ____________ Ashley Garza: ____________ Movimientos: ____________ Comienzo hora: ____________ Ashley Garza: ____________ Ashley Garza: ____________ Movimientos: ____________ Comienzo hora: ____________ Ashley Garza: ____________  Ashley Garza: ____________ Movimientos: ____________ Comienzo hora: ____________ Ashley Garza: ____________ Ashley Garza: ____________ Movimientos: ____________ Comienzo hora: ____________ Ashley Garza: ____________ Ashley Garza: ____________ Movimientos: ____________ Comienzo hora: ____________ Ashley Garza: ____________ Ashley Garza: ____________ Movimientos: ____________ Comienzo hora: ____________ Ashley Garza: ____________ Ashley Garza: ____________ Movimientos: ____________ Comienzo hora: ____________ Ashley Garza: ____________ Ashley Garza: ____________ Movimientos: ____________ Comienzo hora: ____________ Ashley Garza: ____________ Ashley Garza: ____________ Movimientos: ____________ Comienzo hora: ____________ Ashley Garza: ____________  Ashley Garza: ____________ Movimientos: ____________ Comienzo hora: ____________ Ashley Garza: ____________ Ashley Garza: ____________ Movimientos: ____________ Comienzo hora: ____________ Ashley Garza: ____________ Ashley Garza: ____________ Movimientos: ____________  Comienzo hora: ____________ Ashley Garza: ____________ Ashley Garza: ____________ Movimientos: ____________ Comienzo hora: ____________ Ashley Garza: ____________ Ashley Garza: ____________ Movimientos: ____________ Comienzo hora: ____________ Ashley Garza: ____________ Ashley Garza: ____________ Movimientos: ____________ Comienzo hora: ____________ Ashley Garza: ____________ Ashley Garza: ____________ Movimientos: ____________ Comienzo hora: ____________ Ashley Garza: ____________  Ashley Garza: ____________ Movimientos: ____________ Comienzo hora: ____________ Ashley Garza: ____________ Ashley Garza: ____________ Movimientos: ____________ Comienzo hora: ____________ Ashley Garza: ____________ Ashley Garza: ____________ Movimientos: ____________ Comienzo hora: ____________ Ashley Garza: ____________ Ashley Garza: ____________ Movimientos: ____________ Comienzo hora: ____________ Ashley Garza: ____________ Ashley Garza: ____________ Movimientos:  ____________ Comienzo hora: ____________ Ashley Garza: ____________ Ashley Garza: ____________ Movimientos: ____________ Comienzo hora: ____________ Ashley Garza: ____________ Ashley Garza: ____________ Movimientos: ____________ Comienzo hora: ____________ Ashley Garza: ____________  Ashley Garza: ____________ Movimientos: ____________ Comienzo hora: ____________ Ashley Garza: ____________ Ashley Garza: ____________ Movimientos: ____________ Comienzo hora: ____________ Ashley Garza: ____________ Ashley Garza: ____________ Movimientos: ____________ Comienzo hora: ____________ Ashley Garza: ____________ Ashley Garza: ____________ Movimientos: ____________ Comienzo hora: ____________ Ashley Garza: ____________ Ashley Garza: ____________ Movimientos: ____________ Comienzo hora: ____________ Ashley Garza: ____________ Ashley Garza: ____________ Movimientos: ____________ Comienzo hora: ____________ Ashley Garza: ____________ Ashley Garza: ____________ Movimientos: ____________ Comienzo hora: ____________ Ashley Garza: ____________  Ashley Garza: ____________ Movimientos: ____________ Comienzo hora: ____________ Ashley Garza: ____________ Ashley Garza: ____________ Movimientos:  ____________ Comienzo hora: ____________ Ashley Garza: ____________ Ashley Garza: ____________ Movimientos: ____________ Comienzo hora: ____________ Ashley Garza: ____________ Ashley Garza: ____________ Movimientos: ____________ Comienzo hora: ____________ Ashley Garza: ____________ Ashley Garza: ____________ Movimientos: ____________ Comienzo hora: ____________ Ashley Garza: ____________ Ashley Garza: ____________ Movimientos: ____________ Comienzo hora: ____________ Ashley Garza: ____________ Ashley Garza: ____________ Movimientos: ____________ Comienzo hora: ____________ Ashley Garza: ____________  Ashley Garza: ____________ Movimientos: ____________ Comienzo hora: ____________ Ashley Garza: ____________ Ashley Garza: ____________ Movimientos: ____________ Comienzo hora: ____________ Ashley Garza: ____________ Ashley Garza: ____________ Movimientos: ____________ Comienzo hora: ____________ Ashley Garza: ____________ Ashley Garza: ____________ Movimientos: ____________ Comienzo hora: ____________ Ashley Garza: ____________ Ashley Garza: ____________ Movimientos: ____________ Comienzo hora: ____________ Ashley Garza: ____________ Ashley Garza: ____________ Movimientos: ____________ Comienzo hora: ____________ Ashley Garza: ____________ Ashley Garza: ____________ Movimientos: ____________ Comienzo hora: ____________ Fin hora: ____________  Document Released: 09/28/2007 Document Revised: 06/10/2011 ExitCare Patient Information 2012 [place], [place]. Prevencin de Sport and exercise psychologist (Preventing Preterm Labor) Un parto prematuro ocurre cuando la mujer embarazada tiene contracciones uterinas que causan la apertura, el acortamiento y el afinamiento del cuello del tero, antes de las 37 semanas de [place]. Tendr contracciones regulares cada 2 a 3 minutos. Esto generalmente causa molestias o dolor. CUIDADOS EN EL HOGAR  Consuma una dieta saludable.   [employer] las vitaminas segn le haya indicado el mdico.   Beba una cantidad de lquido suficiente como para Pharmacologist la orina de tono claro o color amarillo plido todos [place].    Descanse y duerma.   No tenga relaciones sexuales si tiene un parto prematuro o alto riesgo de tenerlo.   Siga las instrucciones del mdico acerca de su [place], los medicamentos y los exmenes.   Evite el estrs.   Evite los esfuerzos extenuantes o la actividad fsica extensa.   No fume.  SOLICITE AYUDA DE INMEDIATO SI:   Tiene contracciones.   Siente dolor abdominal.   Tiene sangrado que proviene de la vagina.   Siente dolor al [employer].   Observa una secrecin anormal que proviene de la vagina.   Tiene una temperatura oral de ms de 102 F (38.9 C).  ASEGRESE DE QUE:  Comprende estas instrucciones.   Controlar su enfermedad.   Solicitar ayuda si no mejora o si empeora.  Document Released: 07/24/2010 Document Revised: 06/10/2011 [facility] Patient Information 2012 [place], [place].

## 2011-10-12 ENCOUNTER — Ambulatory Visit (HOSPITAL_COMMUNITY)
Admission: RE | Admit: 2011-10-12 | Discharge: 2011-10-12 | Disposition: A | Discharge status: Home / Self Care | Payer: Self-pay | Source: Ambulatory Visit | Attending: Advanced Practice Midwife | Admitting: Advanced Practice Midwife

## 2011-10-12 ENCOUNTER — Other Ambulatory Visit: Payer: Self-pay | Admitting: Advanced Practice Midwife

## 2011-10-12 DIAGNOSIS — O09299 Supervision of pregnancy with other poor reproductive or obstetric history, unspecified trimester: Secondary | ICD-10-CM | POA: Insufficient documentation

## 2011-10-12 DIAGNOSIS — O34219 Maternal care for unspecified type scar from previous cesarean delivery: Secondary | ICD-10-CM | POA: Insufficient documentation

## 2011-10-12 DIAGNOSIS — Z8751 Personal history of pre-term labor: Secondary | ICD-10-CM | POA: Insufficient documentation

## 2011-10-14 ENCOUNTER — Ambulatory Visit: Payer: Self-pay

## 2011-10-14 ENCOUNTER — Ambulatory Visit (INDEPENDENT_AMBULATORY_CARE_PROVIDER_SITE_OTHER): Payer: Self-pay | Admitting: Obstetrics & Gynecology

## 2011-10-14 ENCOUNTER — Other Ambulatory Visit: Payer: Self-pay | Admitting: Obstetrics and Gynecology

## 2011-10-14 VITALS — BP 109/70 | Temp 98.5°F | Wt 170.0 lb

## 2011-10-14 DIAGNOSIS — O365990 Maternal care for other known or suspected poor fetal growth, unspecified trimester, not applicable or unspecified: Secondary | ICD-10-CM | POA: Insufficient documentation

## 2011-10-14 DIAGNOSIS — Z3480 Encounter for supervision of other normal pregnancy, unspecified trimester: Secondary | ICD-10-CM

## 2011-10-14 DIAGNOSIS — IMO0002 Reserved for concepts with insufficient information to code with codable children: Secondary | ICD-10-CM

## 2011-10-14 LAB — POCT URINALYSIS DIP (DEVICE)
Bilirubin Urine: NEGATIVE
Glucose, UA: NEGATIVE mg/dL
Nitrite: NEGATIVE

## 2011-10-14 NOTE — Progress Notes (Signed)
MFM appointment scheduled 10/18/11 at 1030 am.

## 2011-10-14 NOTE — Progress Notes (Signed)
P=101, Used Interpreter [employer], edema in feet only,

## 2011-10-14 NOTE — Progress Notes (Signed)
[place] 10/12/11 21%ile, AFI 9 cm 15%ile, nl doppler. Needs weekly BPP and AFI, NST biweekly. Growth [place] 4/30. MFM consult. NST today reactive

## 2011-10-14 NOTE — Patient Instructions (Signed)
Prevencin de parto prematuro (Preventing Preterm Labor) Un parto prematuro ocurre cuando la mujer embarazada tiene contracciones uterinas que causan la apertura, el acortamiento y el afinamiento del cuello del tero, antes de las 37 semanas de embarazo. Tendr contracciones regulares cada 2 a 3 minutos. Esto generalmente causa molestias o dolor. CUIDADOS EN EL HOGAR  Consuma una dieta saludable.   Tome las vitaminas segn le haya indicado el mdico.   Beba una cantidad de lquido suficiente como para mantener la orina de tono claro o color amarillo plido todos los das.   Descanse y duerma.   No tenga relaciones sexuales si tiene un parto prematuro o alto riesgo de tenerlo.   Siga las instrucciones del mdico acerca de su actividad, los medicamentos y los exmenes.   Evite el estrs.   Evite los esfuerzos extenuantes o la actividad fsica extensa.   No fume.  SOLICITE AYUDA DE INMEDIATO SI:   Tiene contracciones.   Siente dolor abdominal.   Tiene sangrado que proviene de la vagina.   Siente dolor al orinar.   Observa una secrecin anormal que proviene de la vagina.   Tiene una temperatura oral de ms de 102 F (38.9 C).  ASEGRESE DE QUE:  Comprende estas instrucciones.   Controlar su enfermedad.   Solicitar ayuda si no mejora o si empeora.  Document Released: 07/24/2010 Document Revised: 06/10/2011 ExitCare Patient Information 2012 [employer]. 

## 2011-10-18 ENCOUNTER — Other Ambulatory Visit: Payer: Self-pay | Admitting: Obstetrics and Gynecology

## 2011-10-18 ENCOUNTER — Ambulatory Visit (HOSPITAL_COMMUNITY)
Admission: RE | Admit: 2011-10-18 | Discharge: 2011-10-18 | Disposition: A | Discharge status: Home / Self Care | Payer: Self-pay | Source: Ambulatory Visit | Attending: Obstetrics and Gynecology | Admitting: Obstetrics and Gynecology

## 2011-10-18 VITALS — BP 109/73 | HR 98 | Wt 169.0 lb

## 2011-10-18 DIAGNOSIS — O34219 Maternal care for unspecified type scar from previous cesarean delivery: Secondary | ICD-10-CM | POA: Insufficient documentation

## 2011-10-18 DIAGNOSIS — Z8751 Personal history of pre-term labor: Secondary | ICD-10-CM | POA: Insufficient documentation

## 2011-10-18 DIAGNOSIS — O09299 Supervision of pregnancy with other poor reproductive or obstetric history, unspecified trimester: Secondary | ICD-10-CM | POA: Insufficient documentation

## 2011-10-18 DIAGNOSIS — Z3480 Encounter for supervision of other normal pregnancy, unspecified trimester: Secondary | ICD-10-CM

## 2011-10-21 ENCOUNTER — Ambulatory Visit (INDEPENDENT_AMBULATORY_CARE_PROVIDER_SITE_OTHER): Payer: Self-pay | Admitting: Obstetrics & Gynecology

## 2011-10-21 VITALS — BP 108/78 | Temp 97.0°F | Wt 169.7 lb

## 2011-10-21 DIAGNOSIS — O365990 Maternal care for other known or suspected poor fetal growth, unspecified trimester, not applicable or unspecified: Secondary | ICD-10-CM

## 2011-10-21 DIAGNOSIS — O09219 Supervision of pregnancy with history of pre-term labor, unspecified trimester: Secondary | ICD-10-CM

## 2011-10-21 DIAGNOSIS — O09299 Supervision of pregnancy with other poor reproductive or obstetric history, unspecified trimester: Secondary | ICD-10-CM

## 2011-10-21 DIAGNOSIS — IMO0002 Reserved for concepts with insufficient information to code with codable children: Secondary | ICD-10-CM | POA: Insufficient documentation

## 2011-10-21 DIAGNOSIS — O1210 Gestational proteinuria, unspecified trimester: Secondary | ICD-10-CM

## 2011-10-21 LAB — POCT URINALYSIS DIP (DEVICE)
Glucose, UA: NEGATIVE mg/dL
Ketones, ur: NEGATIVE mg/dL
Nitrite: NEGATIVE
Protein, ur: 30 mg/dL — AB
Urobilinogen, UA: 0.2 mg/dL (ref 0.0–1.0)
pH: 7 (ref 5.0–8.0)
pH: 7 (ref 5.0–8.0)

## 2011-10-21 NOTE — Progress Notes (Signed)
Pt reports decr. FM x6 days.   Good FM during NST today, pt was aware of some but not all.  *Note:  Pt had [place] @ MFM on 4/15, BPP=8/8, UA doppler wnl, EFW <10%, AFI=8.62.   Next [place] for BPP, UA doppler @ MFM on 4/22.

## 2011-10-21 NOTE — Progress Notes (Signed)
Pulse: 102

## 2011-10-22 LAB — GC/CHLAMYDIA PROBE AMP, GENITAL
Chlamydia, DNA Probe: NEGATIVE
GC Probe Amp, Genital: NEGATIVE

## 2011-10-23 LAB — CULTURE, OB URINE
Colony Count: NO GROWTH
Organism ID, Bacteria: NO GROWTH

## 2011-10-25 ENCOUNTER — Ambulatory Visit (HOSPITAL_COMMUNITY)
Admission: RE | Admit: 2011-10-25 | Discharge: 2011-10-25 | Disposition: A | Discharge status: Home / Self Care | Payer: Self-pay | Source: Ambulatory Visit | Attending: Family Medicine | Admitting: Family Medicine

## 2011-10-25 DIAGNOSIS — Z3480 Encounter for supervision of other normal pregnancy, unspecified trimester: Secondary | ICD-10-CM

## 2011-10-25 DIAGNOSIS — Z8751 Personal history of pre-term labor: Secondary | ICD-10-CM | POA: Insufficient documentation

## 2011-10-25 DIAGNOSIS — O34219 Maternal care for unspecified type scar from previous cesarean delivery: Secondary | ICD-10-CM | POA: Insufficient documentation

## 2011-10-25 DIAGNOSIS — O09299 Supervision of pregnancy with other poor reproductive or obstetric history, unspecified trimester: Secondary | ICD-10-CM | POA: Insufficient documentation

## 2011-10-28 ENCOUNTER — Ambulatory Visit (INDEPENDENT_AMBULATORY_CARE_PROVIDER_SITE_OTHER): Payer: Self-pay | Admitting: Obstetrics & Gynecology

## 2011-10-28 VITALS — BP 100/71 | Temp 97.2°F | Wt 171.2 lb

## 2011-10-28 DIAGNOSIS — O09219 Supervision of pregnancy with history of pre-term labor, unspecified trimester: Secondary | ICD-10-CM

## 2011-10-28 DIAGNOSIS — O365990 Maternal care for other known or suspected poor fetal growth, unspecified trimester, not applicable or unspecified: Secondary | ICD-10-CM

## 2011-10-28 DIAGNOSIS — IMO0002 Reserved for concepts with insufficient information to code with codable children: Secondary | ICD-10-CM

## 2011-10-28 DIAGNOSIS — O09899 Supervision of other high risk pregnancies, unspecified trimester: Secondary | ICD-10-CM

## 2011-10-28 DIAGNOSIS — O34219 Maternal care for unspecified type scar from previous cesarean delivery: Secondary | ICD-10-CM

## 2011-10-28 LAB — POCT URINALYSIS DIP (DEVICE)
Hgb urine dipstick: NEGATIVE
Ketones, ur: NEGATIVE mg/dL
Protein, ur: NEGATIVE mg/dL
Specific Gravity, Urine: 1.02 (ref 1.005–1.030)
Urobilinogen, UA: 0.2 mg/dL (ref 0.0–1.0)

## 2011-10-28 NOTE — Progress Notes (Signed)
Pulse 92. Pt reports small amount of watery d/c

## 2011-10-28 NOTE — Progress Notes (Signed)
[place] yesterday at [facility] showed low normal AFI 16%ile and [place] 4/15 <10%ile growth, recommend delivery 37-38 weeks. Scheduled 4/27 0900, case [id number]  NST today

## 2011-10-28 NOTE — Patient Instructions (Signed)
Cesarean Delivery  °Cesarean delivery is the birth of a baby through a cut (incision) in the abdomen and womb (uterus).  °LET YOUR CAREGIVER KNOW ABOUT: °· Complications involving the pregnancy.  °· Allergies.  °· Medicines taken including herbs, eyedrops, over-the-counter medicines, and creams.  °· Use of steroids (by mouth or creams).  °· Previous problems with anesthetics or numbing medicine.  °· Previous surgery.  °· History of blood clots.  °· History of bleeding or blood problems.  °· Other health problems.  °RISKS AND COMPLICATIONS  °· Bleeding.  °· Infection.  °· Blood clots.  °· Injury to surrounding organs.  °· Anesthesia problems.  °· Injury to the baby.  °BEFORE THE PROCEDURE  °· A tube (Foley catheter) will be placed in your bladder. The Foley catheter drains the urine from your bladder into a bag. This keeps your bladder empty during surgery.  °· An intravenous access tube (IV) will be placed in your arm.  °· Hair may be removed from your pubic area and your lower abdomen. This is to prevent infection in the incision site.  °· You may be given an antacid medicine to drink. This will prevent acid contents in your stomach from going into your lungs if you vomit during the surgery.  °· You may be given an antibiotic medicine to prevent infection.  °PROCEDURE  °· You may be given medicine to numb the lower half of your body (regional anesthetic). If you were in labor, you may have already had an epidural in place which can be used in both labor and cesarean delivery. You may possibly be given medicine to make you sleep (general anesthetic) though this is not as common.  °· An incision will be made in your abdomen that extends to your uterus. There are 2 basic kinds of incisions:  °· The horizontal (transverse) incision. Horizontal incisions are used for most routine cesarean deliveries.  °· The vertical (up and down) incision. This is less commonly used. This is most often reserved for women who have a  serious complication (extreme prematurity) or under emergency situations.   °· The horizontal and vertical incisions may both be used at the same time. However, this is very uncommon.  °· Your baby will then be delivered.  °AFTER THE PROCEDURE  °· If you were awake during the surgery, you will see your baby right away. If you were asleep, you will see your baby as soon as you are awake.  °· You may breastfeed your baby after surgery.  °· You may be able to get up and walk the same day as the surgery. If you need to stay in bed for a period of time, you will receive help to turn, cough, and take deep breaths after surgery. This helps prevent lung problems such as pneumonia.  °· Do not get out of bed alone the first time after surgery. You will need help getting out of bed until you are able to do this by yourself.  °· You may be able to shower the day after your cesarean delivery.  After the bandage (dressing) is taken off the incision site, a nurse will assist you to shower, if you like.   °· You will have pneumatic compressing hose placed on your feet or lower legs. These hose are used to prevent blood clots. When you are up and walking regularly, they will no longer be necessary.   °· Do not cross your legs when you sit.  °· Save any blood clots that you   pass. If you pass a clot while on the toilet, do not flush it. Call for the nurse. Tell the nurse if you think you are bleeding too much or passing too many clots.  °· Start drinking liquids and eating food as directed by your caregiver. If your stomach is not ready, drinking and eating too soon can cause an increase in bloating and swelling of your intestine and abdomen. This is very uncomfortable.  °· You will be given medicine as needed. Let your caregivers know if you are hurting. They want you to be comfortable. You may also be given an antibiotic to prevent an infection.  °· Your IV will be taken out when you are drinking a reasonable amount of fluids. The  Foley catheter is taken out when you are up and walking.  °· If your blood type is Rh negative and your baby's blood type is Rh positive, you will be given a shot of anti-D immune globulin. This shot prevents you from having Rh problems with a future pregnancy. You should get the shot even if you had your tubes tied (tubal ligation).  °· If you are allowed to take the baby for a walk, place the baby in the bassinet and push it. Do not carry your baby in your arms.  °Document Released: 06/21/2005 Document Revised: 06/10/2011 Document Reviewed: 10/16/2010 °ExitCare® Patient Information ©2012 [employer], LLC. °

## 2011-10-29 ENCOUNTER — Encounter (HOSPITAL_COMMUNITY): Payer: Self-pay | Admitting: Pharmacy Technician

## 2011-10-29 ENCOUNTER — Encounter (HOSPITAL_COMMUNITY): Payer: Self-pay

## 2011-10-29 ENCOUNTER — Encounter (HOSPITAL_COMMUNITY)
Admission: RE | Admit: 2011-10-29 | Discharge: 2011-10-29 | Disposition: A | Discharge status: Home / Self Care | Payer: Medicaid Other | Source: Ambulatory Visit | Attending: Family Medicine | Admitting: Family Medicine

## 2011-10-29 HISTORY — DX: Other specified health status: Z78.9

## 2011-10-29 LAB — CBC
HCT: 34.4 % — ABNORMAL LOW (ref 36.0–46.0)
Hemoglobin: 10.7 g/dL — ABNORMAL LOW (ref 12.0–15.0)
MCH: 23.6 pg — ABNORMAL LOW (ref 26.0–34.0)
MCHC: 31.1 g/dL (ref 30.0–36.0)
RBC: 4.53 MIL/uL (ref 3.87–5.11)

## 2011-10-29 LAB — SURGICAL PCR SCREEN: Staphylococcus aureus: NEGATIVE

## 2011-10-29 MED ORDER — CEFAZOLIN SODIUM-DEXTROSE 2-3 GM-% IV SOLR
2.0000 g | INTRAVENOUS | Status: AC
  Administered 2011-10-30: 2 g via INTRAVENOUS
  Filled 2011-10-29: qty 50

## 2011-10-29 NOTE — Patient Instructions (Addendum)
YOUR PROCEDURE IS SCHEDULED ON:10/30/11  ENTER THROUGH THE MAIN ENTRANCE OF [facility] AT:-730 am  USE DESK PHONE AND DIAL [id number] TO INFORM us OF YOUR ARRIVAL  CALL [phone] IF YOU HAVE ANY QUESTIONS OR PROBLEMS PRIOR TO YOUR ARRIVAL.  REMEMBER: DO NOT EAT OR DRINK AFTER MIDNIGHT : tonight    YOU MAY BRUSH YOUR TEETH THE MORNING OF SURGERY   TAKE THESE MEDICINES THE DAY OF SURGERY WITH SIP OF WATER:none   DO NOT WEAR JEWELRY, EYE MAKEUP, LIPSTICK OR DARK FINGERNAIL POLISH DO NOT WEAR LOTIONS  DO NOT SHAVE FOR 48 HOURS PRIOR TO SURGERY  YOU WILL NOT BE ALLOWED TO DRIVE YOURSELF HOME.  NAME OF DRIVERModesta [phone]

## 2011-10-30 ENCOUNTER — Encounter (HOSPITAL_COMMUNITY)
Admission: RE | Disposition: A | Discharge status: Home / Self Care | Payer: Self-pay | Source: Ambulatory Visit | Attending: Family Medicine

## 2011-10-30 ENCOUNTER — Inpatient Hospital Stay (HOSPITAL_COMMUNITY): Payer: Medicaid Other

## 2011-10-30 ENCOUNTER — Inpatient Hospital Stay (HOSPITAL_COMMUNITY)
Admission: RE | Admit: 2011-10-30 | Discharge: 2011-11-01 | DRG: 765 | Disposition: A | Discharge status: Home / Self Care | Payer: Medicaid Other | Source: Ambulatory Visit | Attending: Family Medicine | Admitting: Family Medicine

## 2011-10-30 ENCOUNTER — Encounter (HOSPITAL_COMMUNITY): Payer: Self-pay

## 2011-10-30 ENCOUNTER — Encounter (HOSPITAL_COMMUNITY): Payer: Self-pay | Admitting: Family Medicine

## 2011-10-30 DIAGNOSIS — Z01812 Encounter for preprocedural laboratory examination: Secondary | ICD-10-CM

## 2011-10-30 DIAGNOSIS — O365990 Maternal care for other known or suspected poor fetal growth, unspecified trimester, not applicable or unspecified: Secondary | ICD-10-CM

## 2011-10-30 DIAGNOSIS — Z01818 Encounter for other preprocedural examination: Secondary | ICD-10-CM

## 2011-10-30 DIAGNOSIS — O34219 Maternal care for unspecified type scar from previous cesarean delivery: Secondary | ICD-10-CM

## 2011-10-30 DIAGNOSIS — IMO0002 Reserved for concepts with insufficient information to code with codable children: Secondary | ICD-10-CM | POA: Diagnosis present

## 2011-10-30 DIAGNOSIS — Z98891 History of uterine scar from previous surgery: Secondary | ICD-10-CM

## 2011-10-30 SURGERY — Surgical Case
Anesthesia: Spinal | Site: Abdomen | Wound class: Clean Contaminated

## 2011-10-30 MED ORDER — MORPHINE SULFATE (PF) 0.5 MG/ML IJ SOLN
INTRAMUSCULAR | Status: DC | PRN
  Administered 2011-10-30: .1 mg via INTRATHECAL

## 2011-10-30 MED ORDER — DIPHENHYDRAMINE HCL 50 MG/ML IJ SOLN: 25.0000 mg | INTRAMUSCULAR | Status: DC | PRN

## 2011-10-30 MED ORDER — MIDAZOLAM HCL 2 MG/2ML IJ SOLN: 0.5000 mg | Freq: Once | INTRAMUSCULAR | Status: DC | PRN

## 2011-10-30 MED ORDER — FENTANYL CITRATE 0.05 MG/ML IJ SOLN
INTRAMUSCULAR | Status: DC | PRN
  Administered 2011-10-30: 25 ug via INTRATHECAL

## 2011-10-30 MED ORDER — DIPHENHYDRAMINE HCL 50 MG/ML IJ SOLN: 12.5000 mg | INTRAMUSCULAR | Status: DC | PRN

## 2011-10-30 MED ORDER — BUPIVACAINE IN DEXTROSE 0.75-8.25 % IT SOLN
INTRATHECAL | Status: DC | PRN
  Administered 2011-10-30: 1.5 mL via INTRATHECAL

## 2011-10-30 MED ORDER — KETOROLAC TROMETHAMINE 30 MG/ML IJ SOLN
INTRAMUSCULAR | Status: AC
  Administered 2011-10-30: 30 mg via INTRAVENOUS
  Filled 2011-10-30: qty 1

## 2011-10-30 MED ORDER — PRENATAL MULTIVITAMIN CH
1.0000 | ORAL_TABLET | Freq: Every day | ORAL | Status: DC
  Administered 2011-10-30 – 2011-11-01 (×3): 1 via ORAL
  Filled 2011-10-30 (×3): qty 1

## 2011-10-30 MED ORDER — OXYCODONE-ACETAMINOPHEN 5-325 MG PO TABS
1.0000 | ORAL_TABLET | ORAL | Status: DC | PRN
  Administered 2011-10-31 (×2): 1 via ORAL
  Filled 2011-10-30 (×2): qty 1

## 2011-10-30 MED ORDER — SIMETHICONE 80 MG PO CHEW
80.0000 mg | CHEWABLE_TABLET | Freq: Three times a day (TID) | ORAL | Status: DC
  Administered 2011-10-30 – 2011-11-01 (×6): 80 mg via ORAL

## 2011-10-30 MED ORDER — ONDANSETRON HCL 4 MG/2ML IJ SOLN: 4.0000 mg | Freq: Three times a day (TID) | INTRAMUSCULAR | Status: DC | PRN

## 2011-10-30 MED ORDER — ACETAMINOPHEN 325 MG PO TABS: 325.0000 mg | ORAL_TABLET | ORAL | Status: DC | PRN

## 2011-10-30 MED ORDER — ACETAMINOPHEN 10 MG/ML IV SOLN
1000.0000 mg | Freq: Four times a day (QID) | INTRAVENOUS | Status: AC | PRN
  Filled 2011-10-30: qty 100

## 2011-10-30 MED ORDER — PHENYLEPHRINE HCL 10 MG/ML IJ SOLN
INTRAMUSCULAR | Status: DC | PRN
  Administered 2011-10-30 (×3): 40 ug via INTRAVENOUS

## 2011-10-30 MED ORDER — BUPIVACAINE HCL (PF) 0.25 % IJ SOLN
INTRAMUSCULAR | Status: DC | PRN
  Administered 2011-10-30: 30 mL

## 2011-10-30 MED ORDER — WITCH HAZEL-GLYCERIN EX PADS: 1.0000 "application " | MEDICATED_PAD | CUTANEOUS | Status: DC | PRN

## 2011-10-30 MED ORDER — FENTANYL CITRATE 0.05 MG/ML IJ SOLN
INTRAMUSCULAR | Status: AC
  Filled 2011-10-30: qty 2

## 2011-10-30 MED ORDER — MENTHOL 3 MG MT LOZG: 1.0000 | LOZENGE | OROMUCOSAL | Status: DC | PRN

## 2011-10-30 MED ORDER — OXYTOCIN 10 UNIT/ML IJ SOLN
INTRAMUSCULAR | Status: DC | PRN
  Administered 2011-10-30: 20 [IU]

## 2011-10-30 MED ORDER — SCOPOLAMINE 1 MG/3DAYS TD PT72
1.0000 | MEDICATED_PATCH | Freq: Once | TRANSDERMAL | Status: DC
  Administered 2011-10-30: 1.5 mg via TRANSDERMAL

## 2011-10-30 MED ORDER — DEXTROSE IN LACTATED RINGERS 5 % IV SOLN
INTRAVENOUS | Status: DC
  Administered 2011-10-30: 22:00:00 via INTRAVENOUS

## 2011-10-30 MED ORDER — PROMETHAZINE HCL 25 MG/ML IJ SOLN: 6.2500 mg | INTRAMUSCULAR | Status: DC | PRN

## 2011-10-30 MED ORDER — MORPHINE SULFATE 0.5 MG/ML IJ SOLN
INTRAMUSCULAR | Status: AC
  Filled 2011-10-30: qty 10

## 2011-10-30 MED ORDER — EPHEDRINE 5 MG/ML INJ
INTRAVENOUS | Status: AC
  Filled 2011-10-30: qty 10

## 2011-10-30 MED ORDER — FENTANYL CITRATE 0.05 MG/ML IJ SOLN: 25.0000 ug | INTRAMUSCULAR | Status: DC | PRN

## 2011-10-30 MED ORDER — NALOXONE HCL 0.4 MG/ML IJ SOLN: 0.4000 mg | INTRAMUSCULAR | Status: DC | PRN

## 2011-10-30 MED ORDER — MEPERIDINE HCL 25 MG/ML IJ SOLN: 6.2500 mg | INTRAMUSCULAR | Status: DC | PRN

## 2011-10-30 MED ORDER — SCOPOLAMINE 1 MG/3DAYS TD PT72
MEDICATED_PATCH | TRANSDERMAL | Status: AC
  Administered 2011-10-30: 1.5 mg via TRANSDERMAL
  Filled 2011-10-30: qty 1

## 2011-10-30 MED ORDER — NALBUPHINE HCL 10 MG/ML IJ SOLN
5.0000 mg | INTRAMUSCULAR | Status: DC | PRN
  Filled 2011-10-30: qty 1

## 2011-10-30 MED ORDER — SODIUM CHLORIDE 0.9 % IJ SOLN: 3.0000 mL | INTRAMUSCULAR | Status: DC | PRN

## 2011-10-30 MED ORDER — MEASLES, MUMPS & RUBELLA VAC ~~LOC~~ INJ
0.5000 mL | INJECTION | Freq: Once | SUBCUTANEOUS | Status: DC
  Filled 2011-10-30: qty 0.5

## 2011-10-30 MED ORDER — OXYTOCIN 10 UNIT/ML IJ SOLN
INTRAMUSCULAR | Status: AC
  Filled 2011-10-30: qty 4

## 2011-10-30 MED ORDER — EPHEDRINE SULFATE 50 MG/ML IJ SOLN
INTRAMUSCULAR | Status: DC | PRN
  Administered 2011-10-30: 20 mg via INTRAVENOUS
  Administered 2011-10-30 (×2): 10 mg via INTRAVENOUS

## 2011-10-30 MED ORDER — ZOLPIDEM TARTRATE 5 MG PO TABS: 5.0000 mg | ORAL_TABLET | Freq: Every evening | ORAL | Status: DC | PRN

## 2011-10-30 MED ORDER — SODIUM CHLORIDE 0.9 % IR SOLN
Status: DC | PRN
  Administered 2011-10-30: 1000 mL

## 2011-10-30 MED ORDER — LANOLIN HYDROUS EX OINT: 1.0000 "application " | TOPICAL_OINTMENT | CUTANEOUS | Status: DC | PRN

## 2011-10-30 MED ORDER — DIPHENHYDRAMINE HCL 25 MG PO CAPS: 25.0000 mg | ORAL_CAPSULE | ORAL | Status: DC | PRN

## 2011-10-30 MED ORDER — ONDANSETRON HCL 4 MG/2ML IJ SOLN
INTRAMUSCULAR | Status: AC
  Filled 2011-10-30: qty 2

## 2011-10-30 MED ORDER — ONDANSETRON HCL 4 MG/2ML IJ SOLN
INTRAMUSCULAR | Status: DC | PRN
  Administered 2011-10-30: 4 mg via INTRAVENOUS

## 2011-10-30 MED ORDER — ONDANSETRON HCL 4 MG PO TABS: 4.0000 mg | ORAL_TABLET | ORAL | Status: DC | PRN

## 2011-10-30 MED ORDER — OXYTOCIN 20 UNITS IN LACTATED RINGERS INFUSION - SIMPLE
INTRAVENOUS | Status: AC
  Administered 2011-10-30: 125 mL/h via INTRAVENOUS
  Filled 2011-10-30: qty 1000

## 2011-10-30 MED ORDER — IBUPROFEN 600 MG PO TABS
600.0000 mg | ORAL_TABLET | Freq: Four times a day (QID) | ORAL | Status: DC
  Administered 2011-10-30 – 2011-11-01 (×8): 600 mg via ORAL
  Filled 2011-10-30 (×8): qty 1

## 2011-10-30 MED ORDER — DIPHENHYDRAMINE HCL 25 MG PO CAPS: 25.0000 mg | ORAL_CAPSULE | Freq: Four times a day (QID) | ORAL | Status: DC | PRN

## 2011-10-30 MED ORDER — KETOROLAC TROMETHAMINE 30 MG/ML IJ SOLN
30.0000 mg | Freq: Four times a day (QID) | INTRAMUSCULAR | Status: AC | PRN
  Administered 2011-10-30: 30 mg via INTRAVENOUS

## 2011-10-30 MED ORDER — BUPIVACAINE HCL (PF) 0.25 % IJ SOLN
INTRAMUSCULAR | Status: AC
  Filled 2011-10-30: qty 30

## 2011-10-30 MED ORDER — SIMETHICONE 80 MG PO CHEW: 80.0000 mg | CHEWABLE_TABLET | ORAL | Status: DC | PRN

## 2011-10-30 MED ORDER — SODIUM CHLORIDE 0.9 % IV SOLN
1.0000 ug/kg/h | INTRAVENOUS | Status: DC | PRN
  Filled 2011-10-30: qty 2.5

## 2011-10-30 MED ORDER — OXYTOCIN 20 UNITS IN LACTATED RINGERS INFUSION - SIMPLE
125.0000 mL/h | INTRAVENOUS | Status: AC
  Administered 2011-10-30: 125 mL/h via INTRAVENOUS

## 2011-10-30 MED ORDER — SCOPOLAMINE 1 MG/3DAYS TD PT72
1.0000 | MEDICATED_PATCH | Freq: Once | TRANSDERMAL | Status: DC
  Filled 2011-10-30: qty 1

## 2011-10-30 MED ORDER — TETANUS-DIPHTH-ACELL PERTUSSIS 5-2.5-18.5 LF-MCG/0.5 IM SUSP: 0.5000 mL | Freq: Once | INTRAMUSCULAR | Status: DC

## 2011-10-30 MED ORDER — METOCLOPRAMIDE HCL 5 MG/ML IJ SOLN: 10.0000 mg | Freq: Three times a day (TID) | INTRAMUSCULAR | Status: DC | PRN

## 2011-10-30 MED ORDER — SENNOSIDES-DOCUSATE SODIUM 8.6-50 MG PO TABS
2.0000 | ORAL_TABLET | Freq: Every day | ORAL | Status: DC
  Administered 2011-10-30 – 2011-10-31 (×2): 2 via ORAL

## 2011-10-30 MED ORDER — DIBUCAINE 1 % RE OINT: 1.0000 "application " | TOPICAL_OINTMENT | RECTAL | Status: DC | PRN

## 2011-10-30 MED ORDER — ONDANSETRON HCL 4 MG/2ML IJ SOLN: 4.0000 mg | INTRAMUSCULAR | Status: DC | PRN

## 2011-10-30 MED ORDER — LACTATED RINGERS IV SOLN
INTRAVENOUS | Status: DC
  Administered 2011-10-30: 09:00:00 via INTRAVENOUS
  Administered 2011-10-30: 125 mL/h via INTRAVENOUS
  Administered 2011-10-30: 09:00:00 via INTRAVENOUS

## 2011-10-30 MED ORDER — KETOROLAC TROMETHAMINE 30 MG/ML IJ SOLN: 30.0000 mg | Freq: Four times a day (QID) | INTRAMUSCULAR | Status: AC | PRN

## 2011-10-30 MED ORDER — PHENYLEPHRINE 40 MCG/ML (10ML) SYRINGE FOR IV PUSH (FOR BLOOD PRESSURE SUPPORT)
PREFILLED_SYRINGE | INTRAVENOUS | Status: AC
  Filled 2011-10-30: qty 5

## 2011-10-30 SURGICAL SUPPLY — 32 items
BENZOIN TINCTURE PRP APPL 2/3 (GAUZE/BANDAGES/DRESSINGS) ×2 IMPLANT
CHLORAPREP W/TINT 26ML (MISCELLANEOUS) ×2 IMPLANT
CLOTH BEACON ORANGE TIMEOUT ST (SAFETY) ×2 IMPLANT
DRESSING TELFA 8X3 (GAUZE/BANDAGES/DRESSINGS) ×2 IMPLANT
DRSG COVADERM 4X10 (GAUZE/BANDAGES/DRESSINGS) ×2 IMPLANT
ELECT REM PT RETURN 9FT ADLT (ELECTROSURGICAL) ×2
ELECTRODE REM PT RTRN 9FT ADLT (ELECTROSURGICAL) ×1 IMPLANT
EXTRACTOR VACUUM M CUP 4 TUBE (SUCTIONS) IMPLANT
GAUZE SPONGE 4X4 12PLY STRL LF (GAUZE/BANDAGES/DRESSINGS) IMPLANT
GLOVE BIOGEL PI IND STRL 7.0 (GLOVE) ×1 IMPLANT
GLOVE BIOGEL PI INDICATOR 7.0 (GLOVE) ×1
GLOVE ECLIPSE 7.0 STRL STRAW (GLOVE) ×4 IMPLANT
GOWN PREVENTION PLUS LG XLONG (DISPOSABLE) ×4 IMPLANT
GOWN PREVENTION PLUS XLARGE (GOWN DISPOSABLE) ×2 IMPLANT
KIT ABG SYR 3ML LUER SLIP (SYRINGE) IMPLANT
NEEDLE HYPO 22GX1.5 SAFETY (NEEDLE) ×2 IMPLANT
NEEDLE HYPO 25X5/8 SAFETYGLIDE (NEEDLE) IMPLANT
NS IRRIG 1000ML POUR BTL (IV SOLUTION) ×2 IMPLANT
PACK C SECTION WH (CUSTOM PROCEDURE TRAY) ×2 IMPLANT
PAD ABD 7.5X8 STRL (GAUZE/BANDAGES/DRESSINGS) ×2 IMPLANT
RTRCTR C-SECT PINK 25CM LRG (MISCELLANEOUS) IMPLANT
SLEEVE SCD COMPRESS KNEE MED (MISCELLANEOUS) ×2 IMPLANT
STAPLER VISISTAT 35W (STAPLE) IMPLANT
STRIP CLOSURE SKIN 1/2X4 (GAUZE/BANDAGES/DRESSINGS) ×2 IMPLANT
SUT VIC AB 0 CTX 36 (SUTURE) ×3
SUT VIC AB 0 CTX36XBRD ANBCTRL (SUTURE) ×3 IMPLANT
SUT VIC AB 4-0 KS 27 (SUTURE) ×2 IMPLANT
SYR 30ML LL (SYRINGE) ×2 IMPLANT
TAPE CLOTH SURG 4X10 WHT LF (GAUZE/BANDAGES/DRESSINGS) ×2 IMPLANT
TOWEL OR 17X24 6PK STRL BLUE (TOWEL DISPOSABLE) ×4 IMPLANT
TRAY FOLEY CATH 14FR (SET/KITS/TRAYS/PACK) ×2 IMPLANT
WATER STERILE IRR 1000ML POUR (IV SOLUTION) ×2 IMPLANT

## 2011-10-30 NOTE — Anesthesia Preprocedure Evaluation (Signed)

## 2011-10-30 NOTE — Addendum Note (Signed)
Addendum  created 10/30/11 1901 by Len Blalock, CRNA   Modules edited:Notes Section

## 2011-10-30 NOTE — H&P (Signed)
  Ashley Garza is an 31 y.o. [id number] [redacted]w[redacted]d female.   Chief Complaint: Previous C-section  HPI: [id number] @ [redacted]w[redacted]d with prior C-Section who desires repeat.  Has severe IUGR and is for early delivery based on that by MFM.   Past Medical History  Diagnosis Date  . No pertinent past medical history     Past Surgical History  Procedure Date  . Cholestectomy 09/2010  . Cholecystectomy   . Cesarean section     No family history on file. Social History:  reports that she has never smoked. She has never used smokeless tobacco. She reports that she does not drink alcohol or use illicit drugs.  Allergies: No Known Allergies  Medications Prior to Admission  Medication Sig Dispense Refill  . Prenatal Vit-Fe Fumarate-FA (PRENATAL MULTIVITAMIN) TABS Take 1 tablet by mouth daily.         A comprehensive review of systems was negative.  Blood pressure 110/69, pulse 94, temperature 98.2 F (36.8 C), temperature source Oral, resp. rate 16, last menstrual period 02/09/2011, SpO2 100.00%. BP 110/69  Pulse 94  Temp(Src) 98.2 F (36.8 C) (Oral)  Resp 16  SpO2 100%  LMP 02/09/2011 General appearance: alert, cooperative and appears stated age Neck: supple, symmetrical, trachea midline and thyroid not enlarged, symmetric, no tenderness/mass/nodules Lungs: clear to auscultation bilaterally Heart: regular rate and rhythm Abdomen: soft, non-tender; bowel sounds normal; no masses,  no organomegaly and gravid Extremities: extremities normal, atraumatic, no cyanosis or edema Skin: Skin color, texture, turgor normal. No rashes or lesions Neurologic: Alert and oriented X 3, normal strength and tone. Normal symmetric reflexes. Normal coordination and gait   Lab Results  Component Value Date   WBC 11.0* 10/29/2011   HGB 10.7* 10/29/2011   HCT 34.4* 10/29/2011   MCV 75.9* 10/29/2011   PLT 302 10/29/2011   Lab Results  Component Value Date   PREGTESTUR  Value: NEGATIVE        THE  SENSITIVITY OF THIS METHODOLOGY IS >24 mIU/mL 11/20/2010   PREGSERUM NEGATIVE 09/10/2010     Assessment/Plan Patient Active Problem List  Diagnoses  . History of preterm delivery, currently pregnant  . Previous cesarean section  . IUGR (intrauterine growth restriction) in prior pregnancy, pregnant  . Supervision of high-risk pregnancy  . GBS bacteriuria  . Poor fetal growth, affecting management of mother, antepartum condition or complication  . IUGR (intrauterine growth restriction)   For Repeat C-section. Risks include but are not limited to bleeding, infection, injury to surrounding structures, including bowel, bladder and ureters, blood clots, and death.  Likelihood of success is high.  Pt. Understands risks and agrees to proceed.   Maan Zarcone S 10/30/2011, 8:38 AM

## 2011-10-30 NOTE — Anesthesia Postprocedure Evaluation (Signed)
  Anesthesia Post-op Note  Patient: Ashley Garza  Procedure(s) Performed: Procedure(s) (LRB): CESAREAN SECTION (N/A)  Patient Location: PACU and Mother/Baby  Anesthesia Type: Spinal  Level of Consciousness: awake, alert  and oriented  Airway and Oxygen Therapy: Patient Spontanous Breathing   Post-op Assessment: Patient's Cardiovascular Status Stable and Respiratory Function Stable  Post-op Vital Signs: stable  Complications: No apparent anesthesia complications

## 2011-10-30 NOTE — Anesthesia Postprocedure Evaluation (Signed)
Anesthesia Post Note  Patient: Ashley Garza  Procedure(s) Performed: Procedure(s) (LRB): CESAREAN SECTION (N/A)  Anesthesia type: Spinal  Patient location: PACU  Post pain: Pain level controlled  Post assessment: Post-op Vital signs reviewed  Last Vitals:  Filed Vitals:   10/30/11 1100  BP: 104/69  Pulse: 75  Temp:   Resp: 20    Post vital signs: Reviewed  Level of consciousness: awake  Complications: No apparent anesthesia complications

## 2011-10-30 NOTE — Op Note (Signed)
Preoperative Diagnosis:  IUP @ [redacted]w[redacted]d, Previous C-section, IUGR 4%, Low fluid 8.96  Postoperative Diagnosis:  Same  Procedure: Repeat low transverse cesarean section  Surgeon: Tinnie Gens, M.D.  Assistant: None  Findings: Viable female infant, Apgars and weight per delivery record, Vtx presentation, LOA position  Estimated blood loss: 1000 cc  Complications: None known  Specimens: Placenta to labor and delivery  Reason for procedure: Briefly, the patient is a 31 y.o. [id number] [redacted]w[redacted]d who presents for elective repeat C-section.  It has been recommended that she be delivered between 37-38 wks by MFM due to poor fetal growth and low fluid.  Procedure: Patient is a to the OR where spinal analgesia was administered. She was then placed in a supine position with left lateral tilt. She received 1 g of Ancef and SCDs were in place. A timeout was performed. She was prepped and draped in the usual sterile fashion. A Foley catheter was placed in the bladder. A knife was then used to make a Pfannenstiel incision. This incision was carried out to underlying fascia which was divided in the midline with the knife. The incision was extended laterally, sharply. The fascia was removed from the underlying rectus superiorly and inferiorly bluntly laterally and sharply in the midline.  The rectus was divided in the midline.  The peritoneal cavity was entered bluntly. There was noted to be omentum affixed on the patient's left side.  Alexis retractor was placed inside the incision.  A knife was used to make a low transverse incision on the uterus. This incision was carried down to the amniotic cavity was entered. Clear fluid noted but minimal.  ROM performed with hemostat.  Fetus was in LOA position and was brought up out of the incision without difficulty. Cord was clamped x 2 and cut. Infant taken to waiting pediatrician.  Cord blood was obtained. Placenta was delivered from the uterus.  Uterus was cleaned with dry lap  pads. Uterine incision closed with 0 Vicryl suture in a locked running fashion.  Alexis retractor was removed from the abdomen. Peritoneal closure was done with 0 Vicryl suture.  Fascia is closed with 0 Vicryl suture in a running fashion. Subcutaneous tissue infused with 30cc 0.25% Marcaine.   Skin closed using 3-0 Vicryl on a Keith needle.  Steri strips applied, followed by pressure dressing.  All instrument, needle and lap counts were correct x 2.  Patient was awake and taken to PACU stable.  Infant to PACU with mom, stable.

## 2011-10-30 NOTE — Transfer of Care (Signed)
Immediate Anesthesia Transfer of Care Note  Patient: Ashley Garza  Procedure(s) Performed: Procedure(s) (LRB): CESAREAN SECTION (N/A)  Patient Location: PACU  Anesthesia Type: Spinal  Level of Consciousness: awake, alert  and oriented  Airway & Oxygen Therapy: Patient Spontanous Breathing  Post-op Assessment: Report given to PACU RN and Post -op Vital signs reviewed and stable  Post vital signs: Reviewed and stable  Complications: No apparent anesthesia complications

## 2011-10-30 NOTE — OR Nursing (Signed)
Uterus massaged by S. Harol Shabazz RN.  Two tubes of cord blood to lab.  

## 2011-10-30 NOTE — Anesthesia Procedure Notes (Signed)
Spinal  Patient location during procedure: OR Start time: 10/30/2011 9:07 AM Staffing Anesthesiologist: Brayton Caves R Performed by: anesthesiologist  Preanesthetic Checklist Completed: patient identified, site marked, surgical consent, pre-op evaluation, timeout performed, IV checked, risks and benefits discussed and monitors and equipment checked Spinal Block Patient position: sitting Prep: DuraPrep Patient monitoring: heart rate, cardiac monitor, continuous pulse ox and blood pressure Approach: midline Location: L3-4 Injection technique: single-shot Needle Needle type: Sprotte  Needle gauge: 24 G Needle length: 9 cm Assessment Sensory level: T4 Additional Notes Patient identified.  Risk benefits discussed including failed block, incomplete pain control, headache, nerve damage, paralysis, blood pressure changes, nausea, vomiting, reactions to medication both toxic or allergic, and postpartum back pain.  Patient expressed understanding and wished to proceed.  All questions were answered.  Sterile technique used throughout procedure.  CSF was clear.  No parasthesia or other complications.  Please see nursing notes for vital signs.

## 2011-10-31 LAB — CBC
HCT: 28.8 % — ABNORMAL LOW (ref 36.0–46.0)
MCV: 75.6 fL — ABNORMAL LOW (ref 78.0–100.0)
Platelets: 238 10*3/uL (ref 150–400)
RBC: 3.81 MIL/uL — ABNORMAL LOW (ref 3.87–5.11)
RDW: 16.1 % — ABNORMAL HIGH (ref 11.5–15.5)
WBC: 9.1 10*3/uL (ref 4.0–10.5)

## 2011-10-31 NOTE — Progress Notes (Signed)
POD 1 Subjective: no complaints, up ad lib, voiding, tolerating PO, + flatus and No BM  Objective: Blood pressure 110/77, pulse 86, temperature 97.8 F (36.6 C), temperature source Oral, resp. rate 20, weight 77.565 kg (171 lb), last menstrual period 02/09/2011, SpO2 97.00%, unknown if currently breastfeeding.  Physical Exam:  General: alert, cooperative, appears stated age and no distress Lochia: appropriate Uterine Fundus: firm Incision: Dressing clean dry and intact DVT Evaluation: No evidence of DVT seen on physical exam.   Basename 10/31/11 0500 10/29/11 1004  HGB 9.1* 10.7*  HCT 28.8* 34.4*    Assessment/Plan: Plan for discharge tomorrow. Bottle feeding. Pain well controlled. Unsure of contraception after DC.    LOS: 1 day   Pricilla Moehle, MD 10/31/2011, 7:26 AM

## 2011-10-31 NOTE — Progress Notes (Signed)
I examined pt and agree with documentation above and resident plan of care. MUHAMMAD,Allanah Mcfarland  

## 2011-11-01 ENCOUNTER — Encounter (HOSPITAL_COMMUNITY): Payer: Self-pay | Admitting: Family Medicine

## 2011-11-01 ENCOUNTER — Other Ambulatory Visit (HOSPITAL_COMMUNITY): Payer: Self-pay

## 2011-11-01 ENCOUNTER — Ambulatory Visit (HOSPITAL_COMMUNITY)
Admission: RE | Admit: 2011-11-01 | Discharge: 2011-11-01 | Payer: Medicaid Other | Source: Ambulatory Visit | Attending: Obstetrics & Gynecology | Admitting: Obstetrics & Gynecology

## 2011-11-01 DIAGNOSIS — O10919 Unspecified pre-existing hypertension complicating pregnancy, unspecified trimester: Secondary | ICD-10-CM

## 2011-11-01 MED ORDER — IBUPROFEN 600 MG PO TABS: 600.0000 mg | ORAL_TABLET | Freq: Four times a day (QID) | ORAL | Status: AC | PRN

## 2011-11-01 MED ORDER — OXYCODONE-ACETAMINOPHEN 5-325 MG PO TABS: 1.0000 | ORAL_TABLET | Freq: Four times a day (QID) | ORAL | Status: AC | PRN

## 2011-11-01 NOTE — Discharge Summary (Signed)
Obstetric Discharge Summary Reason for Admission: cesarean section and IUGR and is for early delivery based on that by MFM.  Prenatal Procedures: ultrasound Intrapartum Procedures: cesarean: low cervical, transverse Postpartum Procedures: none Complications-Operative and Postpartum: none Hemoglobin  Date Value Range Status  10/31/2011 9.1* 12.0-15.0 (g/dL) Final     HCT  Date Value Range Status  10/31/2011 28.8* 36.0-46.0 (%) Final    Physical Exam:  General: alert and cooperative Lochia: appropriate Uterine Fundus: firm Incision: CDI DVT Evaluation: No evidence of DVT seen on physical exam.  Discharge Diagnoses: C-section delivery for IUGR and is for early delivery based on that by MFM.   Discharge Information: Date: 11/01/2011 Activity: pelvic rest Diet: routine Medications: Ibuprofen and Percocet Condition: stable Instructions: refer to practice specific booklet Discharge to: home Follow-up Information    Follow up with [facility]. Schedule an appointment as soon as possible for a visit in 6 weeks.   Contact information:   [address] [place] [address] [phone]         Newborn Data: Live born female  Birth Weight: 5 lb 5.9 oz (2435 g) APGAR: 8, 9  Home with mother.  Edd Arbour MD 11/01/2011, 7:52 AM  Patient seen and examined.  Agree with above note.  Candelaria Celeste JEHIEL 11/01/2011 9:28 AM

## 2011-11-01 NOTE — Progress Notes (Signed)
UR chart review completed.  

## 2011-11-01 NOTE — Progress Notes (Signed)
Ms. Besecker was seen for ultrasound appointment today.  Please see [facility] report for details.

## 2011-11-09 ENCOUNTER — Inpatient Hospital Stay (HOSPITAL_COMMUNITY): Admission: RE | Admit: 2011-11-09 | Payer: MEDICAID | Source: Ambulatory Visit | Admitting: Family Medicine

## 2011-11-09 ENCOUNTER — Encounter (HOSPITAL_COMMUNITY): Admission: RE | Payer: Self-pay | Source: Ambulatory Visit

## 2011-11-09 SURGERY — Surgical Case
Anesthesia: Regional | Site: Abdomen

## 2011-12-02 ENCOUNTER — Ambulatory Visit (INDEPENDENT_AMBULATORY_CARE_PROVIDER_SITE_OTHER): Payer: Self-pay | Admitting: Obstetrics & Gynecology

## 2011-12-02 MED ORDER — DOXYCYCLINE HYCLATE 100 MG PO TABS: 100.0000 mg | ORAL_TABLET | Freq: Two times a day (BID) | ORAL | Status: AC

## 2011-12-02 NOTE — Patient Instructions (Signed)
Informacin sobre el dispositivo intrauterino  (Intrauterine Device Information) El dispositivo intrauterino (DIU) se inserta en el tero e impide el embarazo. Hay dos tipos de DIU:   DIU de cobre. Este tipo de DIU est recubierto con un alambre de cobre y se inserta dentro del tero. El cobre hace que el tero y las trompas de [place] produzcan un liquido que destruye los espermatozoides. El DIU de cobre puede permanecer en el lugar durante 10 aos.   DIU hormonal. Este tipo de DIU contiene la hormona progestina (progesterona sinttica). La hormona espesa el moco cervical y evita que los espermatozoides ingresen al tero y tambin afina la membrana que cubre el tero para evitar la implantacin del vulo fertilizado. La hormona debilita o destruye los espermatozoides que ingresan al tero. El DIU hormonal puede permanecer en el lugar durante 5 aos.  El mdico se asegurar de que usted es una buena candidata para usar el DIU cono anticonceptivo. Hable con su mdico acerca de los posibles efectos secundarios.  VENTAJAS  Es muy eficaz, reversible, de accin prolongada y de bajo mantenimiento.   No hay efectos secundarios relacionados con el estrgeno.   El DIU puede ser utilizado durante la lactancia.   No est asociado con el aumento de peso.   Funciona inmediatamente despus de la insercin.   El DIU de cobre no interfiere con las hormonas femeninas.   El DIU con progesterona puede hacer que los perodos menstruales no sean tan abundantes.   El DIU de progesterona puede usarse durante 5 aos.   El DIU de cobre puede usarse durante 10 aos.  DESVENTAJAS  El DIU de progesterona puede estar asociado con patrones de sangrado irregular.   El DIU de cobre puede hacer que el flujo menstrual ms abundante y doloroso.   Puede experimentar clicos y sangrado vaginal despus de la insercin.  Document Released: 12/09/2009 Document Revised: 06/10/2011 ExitCare Patient Information 2012  [employer]. 

## 2011-12-02 NOTE — Progress Notes (Signed)
Patient ID: Ashley Garza, female   DOB: 07/26/1980, 31 y.o.   MRN: [id number] Subjective:     Ashley Garza is a 31 y.o. female who presents for a postpartum visit. She is 1 month postpartum following a spontaneous vaginal delivery. I have fully reviewed the prenatal and intrapartum course. The delivery was at 37 gestational weeks. Outcome: spontaneous vaginal delivery. Anesthesia: epidural. Postpartum course has been complicated by bump felt at vulva. Baby's course has been good. Baby is feeding by bottle - . Bleeding staining only. Bowel function is normal. Bladder function is normal. Patient is not sexually active. Contraception method is IUD. Postpartum depression screening: negative.  The following portions of the patient's history were reviewed and updated as appropriate: allergies, current medications, past family history, past medical history, past social history, past surgical history and problem list.  Review of Systems Pertinent items are noted in HPI.   Objective:    BP 96/69  Pulse 95  Temp(Src) 97.1 F (36.2 C) (Oral)  Ht 5\' 1"  (1.549 m)  Wt 157 lb 8 oz (71.442 kg)  BMI 29.76 kg/m2  Breastfeeding? Yes  General:  alert, cooperative and no distress   Breasts:    Lungs:   Heart:    Abdomen: soft, non-tender; bowel sounds normal; no masses,  no organomegaly   Vulva:  positive for 1 cm nodules deep to right side. c/w lymph nodes  Vagina: normal vagina  Cervix:  no lesions  Corpus: normal  Adnexa:  normal adnexa  Rectal Exam: Not performed.        Assessment:    1 month postpartum exam. Pap smear not done at today's visit. Persistent vulvar nodes  Plan:    1. Contraception: IUD 2. Schedule at [facility] Doxycycline 100 mg BID 7 days  Ashley Garza 12/02/2011 2:44 PM  3. Follow up in: 2 week or as needed. at [facility] for IUD

## 2011-12-04 NOTE — Progress Notes (Signed)
Patient ID: Ashley Garza, female   DOB: March 13, 1981, 31 y.o.   MRN: [id number] Note from 5/30 reviewed  Kolyn Rozario 12/04/2011 7:54 AM

## 2011-12-04 NOTE — Patient Instructions (Signed)
Cuidados luego de un parto por vía vaginal °(Postpartum Care After Vaginal Delivery) °Luego del nacimiento del bebé deberá permanecer en el hospital durante 24 a 72 horas, excepto que hubiera existido algún problema, o usted sufra alguna enfermedad. Mientras se encuentre en el hospital recibirá ayuda e instrucciones por parte de las enfermeras y el médico, quienes cuidarán de usted y su bebé y le darán consejos para amamantarlo correctamente, especialmente si es el primer hijo.  °En caso de ser necesario, le prescribirán analgésicos. Observará una pequeña hemorragia vaginal y deberá cambiar los apósitos con frecuencia. Lávese las manos cuidadosamente con agua y jabón durante al menos 20 segundos luego de cambiarse el apósito o ir al baño. Si elimina coágulos o aumenta la hemorragia, infórmelo a la enfermera. No deseche los coágulos sanguíneos antes de mostrárselos a la enfermera, para asegurarse de que no es tejido placentario. °Si le han colocado una vía intravenosa, se la retirarán dentro de las 24 horas, si no hay problemas. La primera vez que se levante de la cama o tome una ducha, llame a la enfermera para que la ayude que puede sentirse débil, mareada o desmayarse. Si está amamantando, puede sentir contracciones dolorosas en el útero durante algunas semanas. Esto es normal y necesario, ya que de este modo el útero vuelve a su tamaño normal. Si no está amamantando, utilice un sostén de soporte y trate de no tocarse las mamas hasta que haya dejado de producir leche. No deben administrarse hormonas para suprimir la leche, debido a que pueden causar coágulos sanguíneos. Podrá seguir una dieta normal, excepto que sufra diabetes o presente otros problemas de salud.  °La enfermera colocará bolsas con hielo en el sitio de la episiotomía (agrandamiento quirúrgico de la apertura vaginal) para reducir el dolor y la hinchazón. En algunos casos raros hay dificultad para orinar, entonces la enfermera deberá vaciarle la  vejiga con un catéter. Si le han practicado una ligadura tubaria durante el posparto ("trompas atadas", esterilización femenina), esto no hará que permanezca más tiempo en el hospital. °Podrá tener al bebé en su habitación todo el tiempo que lo desee si el bebé no tiene ningún problema. Lleve y traiga al bebé de la nursery dentro de la cunita. No lo lleve en brazos. No abandone el área de posparto. Si la madre es Rh negativa (falta de una proteína en los glóbulos rojos) y el bebé es Rh positivo, la madre debe aplicarse la vacuna RhoGam para evitar problemas con el factor Rh en futuros embarazos °Le darán instrucciones por escrito para usted y el bebé y los medicamentos necesarios cuando reciba el alta médica. Asegúrese que comprende y sigue las indicaciones. °INSTRUCCIONES PARA EL CUIDADO DOMICILIARIO °· Siga las instrucciones y tome los medicamentos que le indicaron cuando le dieron el alta médica.  °· Utilice los medicamentos de venta libre o de prescripción para el dolor, el malestar o la fiebre, según se lo indique el profesional que lo asiste.  °· No tome aspirina, ya que puede causar hemorragias.  °· Aumente sus actividades un poco cada día para tener más fuerza y resistencia.  °· No beba alcohol, especialmente si está amamantando o toma analgésicos.  °· Tómese la temperatura dos veces por día y regístrela.  °· Podrá tener una pequeña hemorragia durante 2 a 4 semanas. Esto es normal.  °· No utilice tampones o duchas vaginales, use toallas higiénicas.  °· Trate de que alguna persona permanezca con usted y la ayude durante los primeros días en el hogar.  °·   Descanse o duerma una siesta cuando el bebé duerma.  °· Si está amamantando, use un buen sostén. Si no está amamantando, use un buen sostén y no estimule los pezones.  °· Consuma una dieta sana y siga tomando las vitaminas prenatales.  °· No conduzca vehículos, no realice actividades pesadas ni viaje hasta que su médico la autorice.  °· No mantenga relaciones  sexuales hasta que el médico lo permita.  °· Consulte con el profesional cuando puede comenzar a realizar actividad física y que tipo de ejercicios puede hacer.  °· Comuníquese inmediatamente con el médico si tiene problemas luego del parto.  °· Comuníquese con el pediatra si tiene problemas con el bebé.  °· Programe su visita de control luego del parto y cúmplala.  °SOLICITE ATENCIÓN MÉDICA SI: °· La temperatura se eleva por encima de 100° F (37.8° C).  °· Aumenta la hemorragia vaginal o elimina coágulos. Conserve algunos coágulos para mostrárselos al médico.  °· Observa sangre o siente dolor al orinar.  °· Presenta secreción vaginal con olor fétido.  °· Aumenta el dolor o la inflamación en el sitio de la episiotomía (agrandamiento quirúrgico de la apertura vaginal).  °· Sufre una cefalea grave.  °· Se siente deprimida.  °· La incisión se abre.  °· Se siente mareada o sufre un desmayo.  °· Aparece una erupción cutánea.  °· Tiene una reacción o problemas con su medicamento.  °· Siente dolor u observa enrojecimiento e hinchazón en el sitio de la vía intravenosa.  °SOLICITE ATENCIÓN MÉDICA DE INMEDIATO SI: °· Siente dolor en el pecho.  °· Comienza a sentir falta de aire.  °· Se desmaya.  °· Siente dolor, con o sin hinchazón e irritación en la pierna.  °· Tiene una hemorragia vaginal abundante, con o sin coágulos  °· Siente dolor en el estómago.  °· Observa una secreción vaginal con mal olor.  °ASEGURESE QUE:  °· Comprende estas instrucciones.  °· Controlará su enfermedad.  °· Solicitará ayuda de inmediato si no mejora o empeora.  °Document Released: 04/18/2007 Document Revised: 06/10/2011 °ExitCare® Patient Information ©2012 [employer]. °

## 2012-06-15 ENCOUNTER — Ambulatory Visit (INDEPENDENT_AMBULATORY_CARE_PROVIDER_SITE_OTHER): Payer: Self-pay | Admitting: Emergency Medicine

## 2012-06-15 ENCOUNTER — Encounter: Payer: Self-pay | Admitting: Emergency Medicine

## 2012-06-15 VITALS — BP 115/78 | HR 76 | Ht 61.0 in | Wt 157.9 lb

## 2012-06-15 DIAGNOSIS — T7840XA Allergy, unspecified, initial encounter: Secondary | ICD-10-CM | POA: Insufficient documentation

## 2012-06-15 DIAGNOSIS — F419 Anxiety disorder, unspecified: Secondary | ICD-10-CM

## 2012-06-15 DIAGNOSIS — M25561 Pain in right knee: Secondary | ICD-10-CM | POA: Insufficient documentation

## 2012-06-15 DIAGNOSIS — Z0000 Encounter for general adult medical examination without abnormal findings: Secondary | ICD-10-CM

## 2012-06-15 DIAGNOSIS — M25569 Pain in unspecified knee: Secondary | ICD-10-CM

## 2012-06-15 DIAGNOSIS — F411 Generalized anxiety disorder: Secondary | ICD-10-CM

## 2012-06-15 DIAGNOSIS — F418 Other specified anxiety disorders: Secondary | ICD-10-CM | POA: Insufficient documentation

## 2012-06-15 MED ORDER — HYDROXYZINE HCL 25 MG PO TABS: 25.0000 mg | ORAL_TABLET | Freq: Three times a day (TID) | ORAL | Status: DC | PRN

## 2012-06-15 MED ORDER — LORATADINE 10 MG PO TABS: 10.0000 mg | ORAL_TABLET | Freq: Every day | ORAL | Status: DC

## 2012-06-15 NOTE — Assessment & Plan Note (Signed)
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  Some concern for possible meniscal injury given report of locking, but McMurray's was negative.  Will treat symptomatically with tylenol 1,000mg  TID prn.  Also discussed importance of maintaining a healthy weight and staying active.  If worsens or tylenol not helping, would consider obtaining x-rays.  Could also consider injection in the future.

## 2012-06-15 NOTE — Assessment & Plan Note (Signed)
Patient describes a systemic allergic reaction with diffuse itchy rash that appears urticarial on phone picture and swelling of eyes and lips; no throat closing sensation.  Unclear allergen; exam negative for dermatographia.  Discussed switching to dye and scent free laundry detergent, avoiding softeners and any scented lotions/body sprays.  She will keep a journal of symptoms and possible triggers.  Will start loratadine 10mg  daily.  Atarax refilled to use as needed for itching.  Follow up in 1 month.
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
## 2012-06-15 NOTE — Assessment & Plan Note (Signed)
History reviewed and updated in EPIC as appropriate.  Will need flu shot at f/u in 1 month.

## 2012-06-15 NOTE — Patient Instructions (Signed)
Fue Psychiatrist conocerte! Para su rodilla - Yo creo que hay algo de artritis bajo la rtula - Que puede tomar tylenol 500mg  tabletas; 2 tabletas hasta 3 veces al da - Para evitar que [employer], por favor, mantener un peso saludable y [place] - Caminar 30 minutos al da - Comer muchas frutas y verduras Para el picor - Se trata de una alergia a algo - Por favor cambie su detergente de ropa a Agricultural engineer Free (sin colorantes ni aromas) - Llevar un diario de cuando el prurito y la inflamacin que ocurre y lo que comi o cuando se expone a - Tome 10 mg de loratadina una vez al da (no debe darle sueo)  Seguimiento en 1 mes

## 2012-07-21 ENCOUNTER — Ambulatory Visit: Payer: Self-pay | Admitting: Emergency Medicine

## 2012-08-02 ENCOUNTER — Ambulatory Visit: Payer: Self-pay | Admitting: Emergency Medicine

## 2012-09-04 ENCOUNTER — Telehealth: Payer: Self-pay | Admitting: Emergency Medicine

## 2012-09-04 DIAGNOSIS — Z0120 Encounter for dental examination and cleaning without abnormal findings: Secondary | ICD-10-CM

## 2012-09-04 NOTE — Telephone Encounter (Signed)
Patient needs a referral to the dental clinic.  She has the orange card.

## 2012-09-05 NOTE — Telephone Encounter (Signed)
Called pt. Unable to communicate. (spanish speaking only) Will fwd. To [employer] to call pt and inform, that a referral to the dental clinic was faxed. Waiting time for appointment could be a couple of months. Lorenda Hatchet, Renato Battles

## 2012-09-06 NOTE — Telephone Encounter (Signed)
Called Pt and LVM.  [employer]

## 2012-10-18 ENCOUNTER — Ambulatory Visit: Payer: No Typology Code available for payment source

## 2013-01-24 ENCOUNTER — Encounter: Payer: Self-pay | Admitting: Emergency Medicine

## 2013-01-24 ENCOUNTER — Ambulatory Visit (INDEPENDENT_AMBULATORY_CARE_PROVIDER_SITE_OTHER): Payer: No Typology Code available for payment source | Admitting: Emergency Medicine

## 2013-01-24 VITALS — BP 115/76 | HR 98 | Ht 61.0 in | Wt 153.0 lb

## 2013-01-24 DIAGNOSIS — F418 Other specified anxiety disorders: Secondary | ICD-10-CM

## 2013-01-24 DIAGNOSIS — Z0000 Encounter for general adult medical examination without abnormal findings: Secondary | ICD-10-CM

## 2013-01-24 DIAGNOSIS — F341 Dysthymic disorder: Secondary | ICD-10-CM

## 2013-01-24 MED ORDER — CITALOPRAM HYDROBROMIDE 20 MG PO TABS: 20.0000 mg | ORAL_TABLET | Freq: Every day | ORAL | Status: DC

## 2013-01-24 NOTE — Progress Notes (Signed)
  Subjective:    Patient ID: Ashley Garza, female    DOB: Nov 27, 1980, 32 y.o.   MRN: [id number]  HPI Ashley Garza is here for a CPE.  I have reviewed and updated the following as appropriate: allergies, current medications, past family history, past medical history, past social history, past surgical history and problem list SHx: never smoker  Depression screen is positive on ROS.  Reports that she had been feeling sad and stressed since the birth of her 2nd child 14 months ago.  Associate with decreased energy, decreased concentration and lack of interest.  She states she is normal an active, busy person, but just cannot find the energy anymore.  She denies every being on medication before.  No SI/HI.  Finds this very bothersome in her day to day life.   Review of Systems Patient Information Form: Screening and ROS  Do you feel safe in relationships? yes PHQ-2:positive  Review of Symptoms  General:  Negative for nexplained weight loss, fever Skin: Negative for new or changing mole, sore that won't heal HEENT: Negative for trouble hearing, trouble seeing, ringing in ears, mouth sores, hoarseness, change in voice, dysphagia. CV:  Negative for chest pain, dyspnea, edema, palpitations Resp: Negative for cough, dyspnea, hemoptysis GI: Negative for nausea, vomiting, diarrhea, constipation, abdominal pain, melena, hematochezia. GU: Negative for dysuria, incontinence, urinary hesitance, hematuria, vaginal or penile discharge, polyuria, sexual difficulty, lumps in testicle or breasts MSK: Negative for muscle cramps or aches, joint pain or swelling Neuro: Negative for + headaches, weakness, numbness, dizziness, passing out/fainting Psych: Negative for + depression, anxiety, memory problems      Objective:   Physical Exam BP 115/76  Pulse 98  Ht 5\' 1"  (1.549 m)  Wt 153 lb (69.4 kg)  BMI 28.92 kg/m2 Gen: alert, cooperative, NAD HEENT: AT/[employer], sclera white,  PERRL, MMM, no pharyngeal erythema or exudate Neck: supple, no LAD CV: RRR, no murmurs Pulm: CTAB, no wheezes or rales Abd: +BS, soft, NTND Ext: no edema, 2+ DP pulses bilaterally Neuro: oriented x3; gait normal     Assessment & Plan:

## 2013-01-24 NOTE — Assessment & Plan Note (Signed)
Up to date on health maintenance.   Pap due 04/2014. Patient interested in weight loss, but cannot find the energy to do anything about it.

## 2013-01-24 NOTE — Patient Instructions (Addendum)
It was nice to see you!  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 Take it once a day everyday. It will take 6-8 weeks to reach full affect. If you start having thoughts of hurting yourself or someone else, stop the medicine and call me right away.  Follow up in 2 weeks.  Fue bueno verte!   Vamos a iniciar un medicamento para la depresin post-parto se llama Celexa.  Tomar una vez al [employer]  Se llevar a 6-8 semanas para alcanzar el pleno efecto.  Si usted comienza a tener pensamientos de [employer] a s mismo o alguien ms, deje de tomarlo y me llaman de inmediato.   Dar seguimiento en 2 semanas.

## 2013-01-24 NOTE — Assessment & Plan Note (Signed)
No SI/HI. Will start celexa 20mg  daily. Follow up in 2 weeks.

## 2013-02-14 ENCOUNTER — Ambulatory Visit: Payer: No Typology Code available for payment source | Admitting: Emergency Medicine

## 2013-02-28 ENCOUNTER — Ambulatory Visit: Payer: No Typology Code available for payment source | Attending: Internal Medicine

## 2013-06-20 IMAGING — US US OB FOLLOW-UP
1 series · 12 of 28 positions shown · non-contrast
Comparison: none

[Series 1: us ob follow up · 12 of 40 slices shown]
[im 2/40]
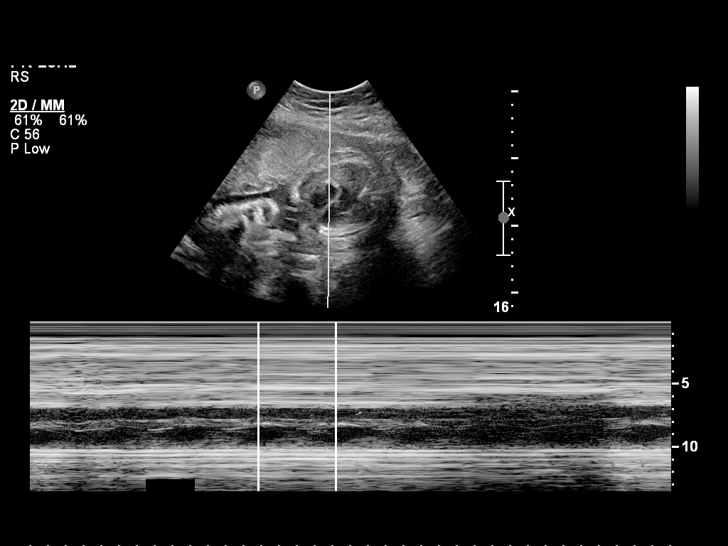
[im 5/40]
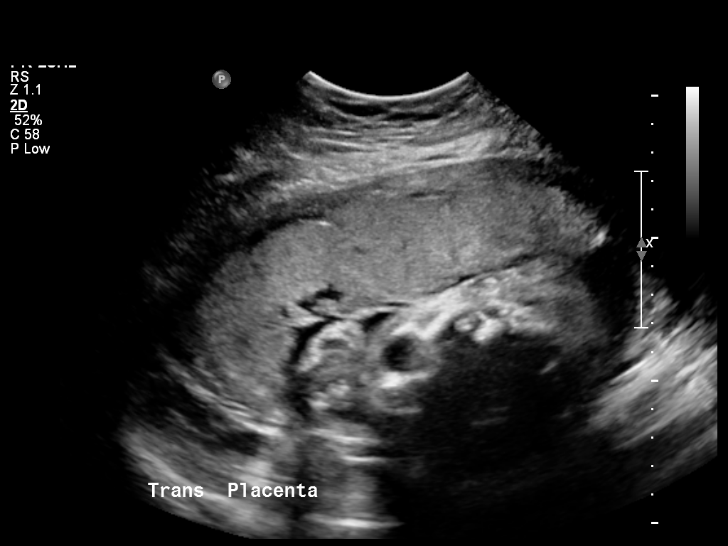
[im 8/40]
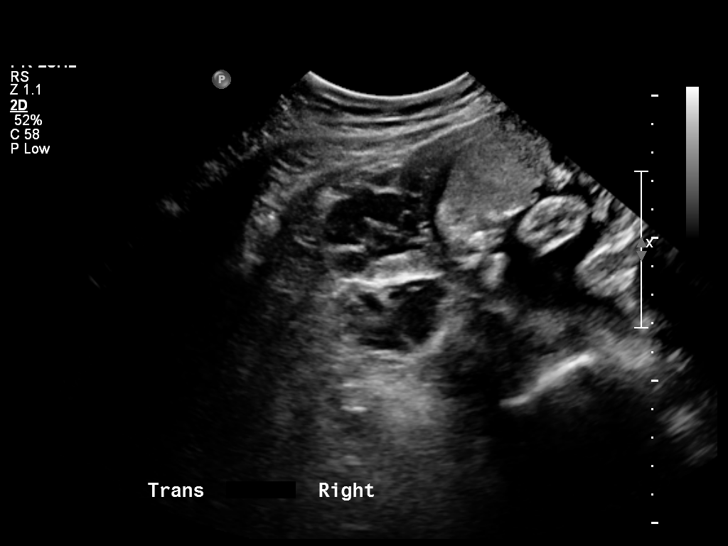
[im 12/40]
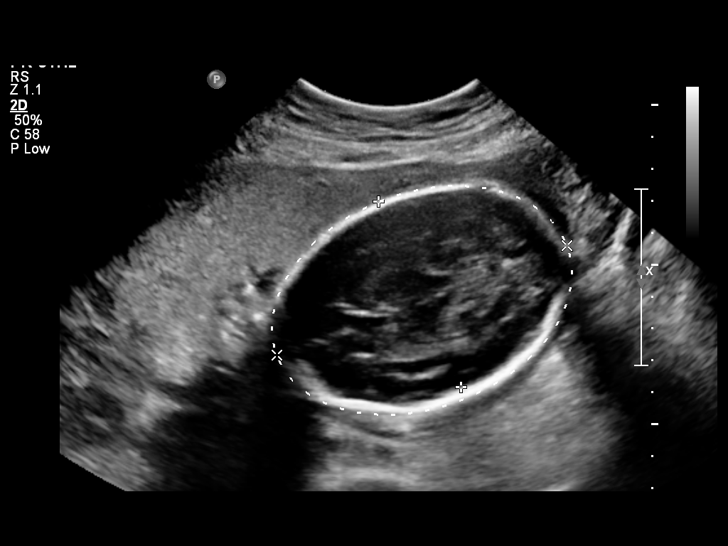
[im 15/40]
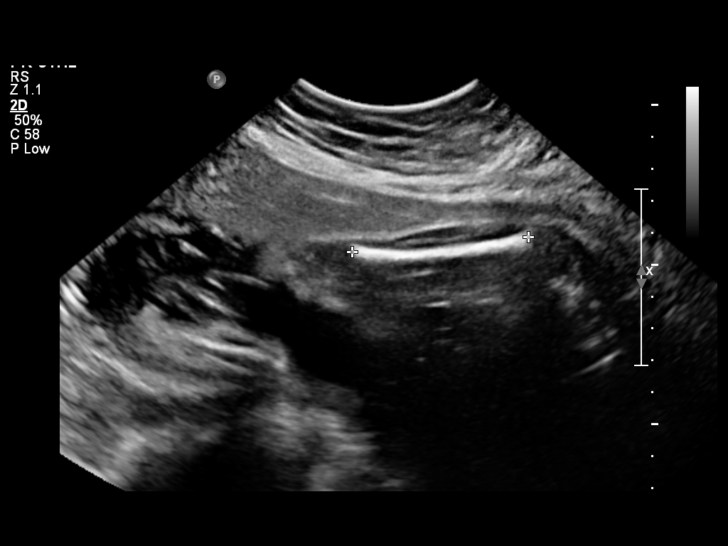
[im 18/40]
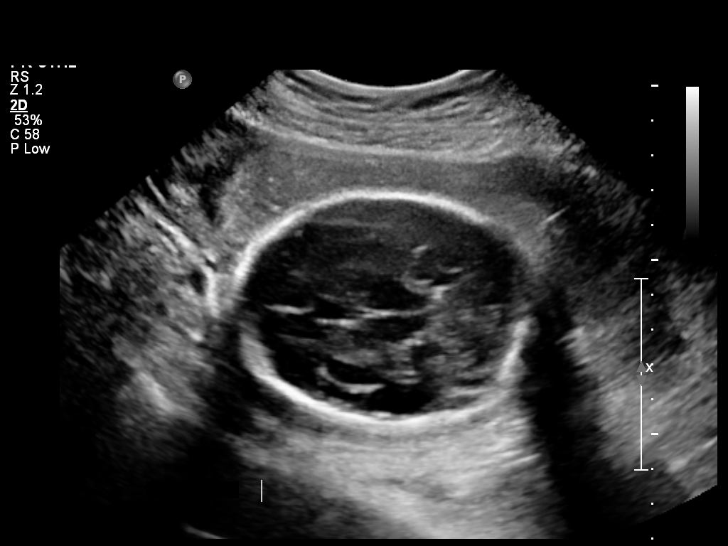
[im 22/40]
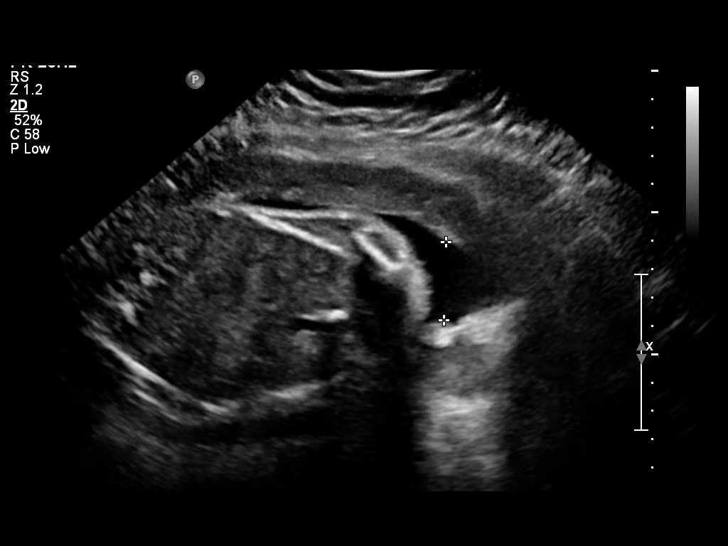
[im 25/40]
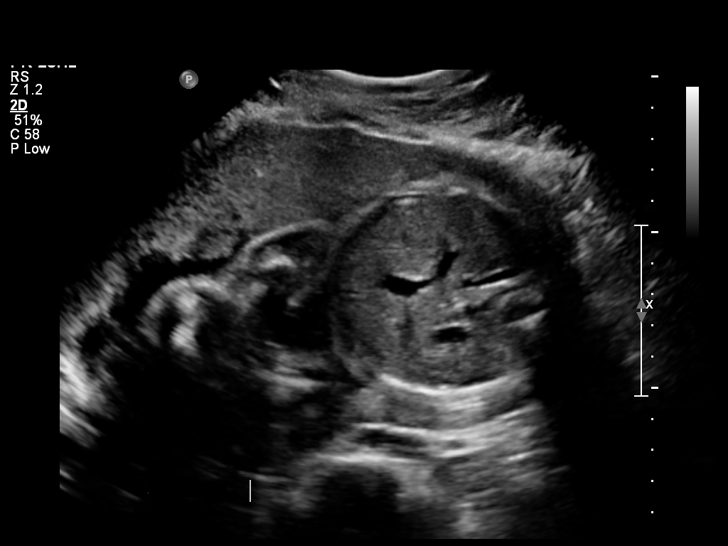
[im 28/40]
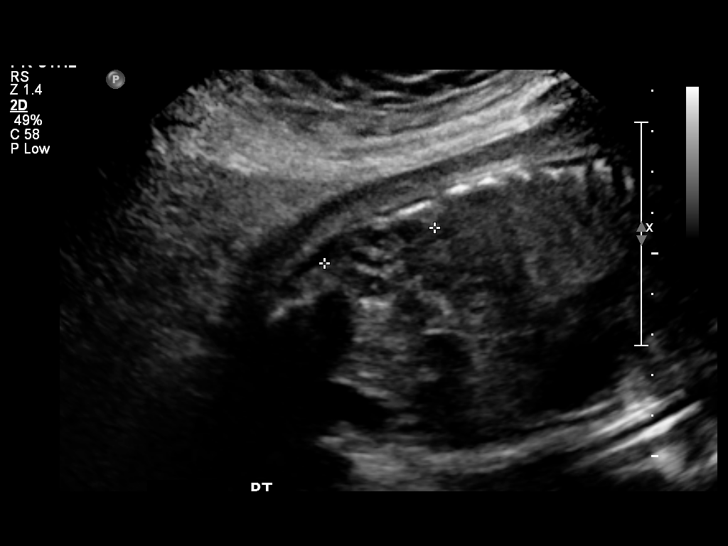
[im 32/40]
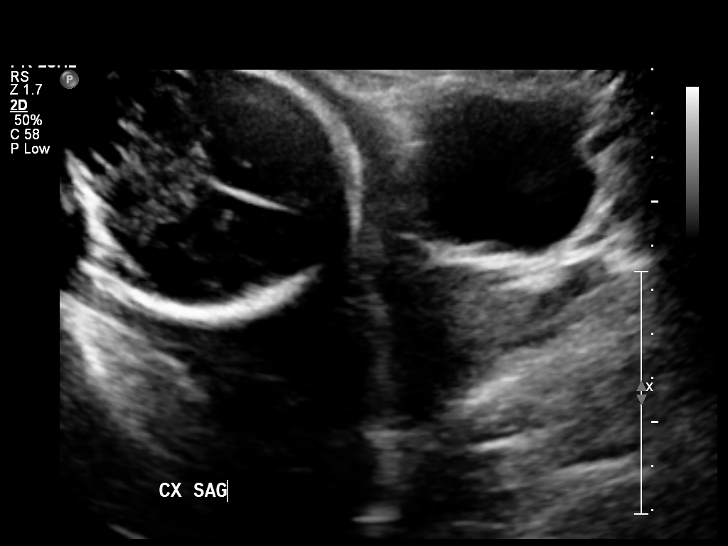
[im 35/40]
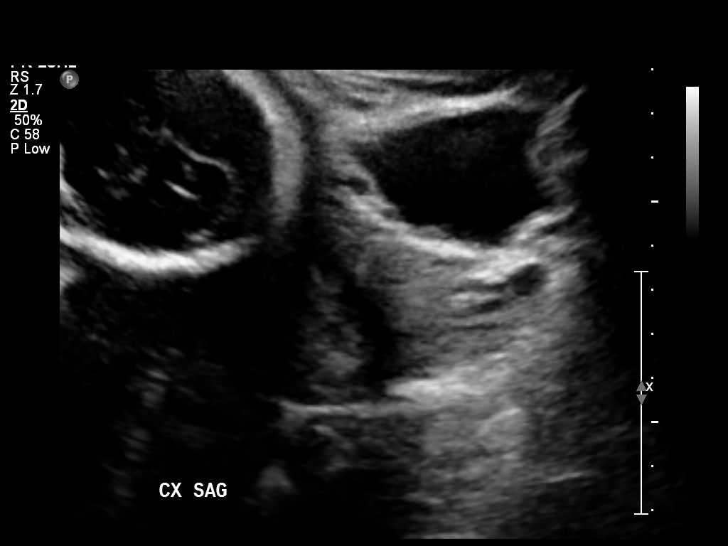
[im 38/40]
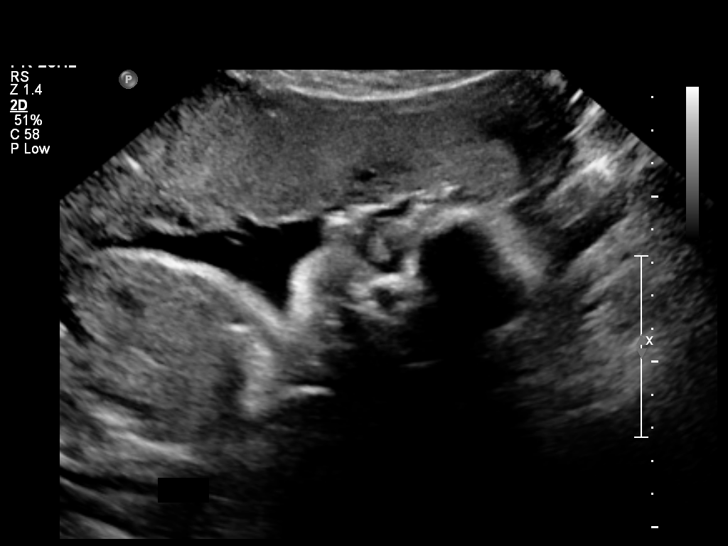

[12 of 28 positions shown; findings below may reference images not displayed]

OBSTETRICS REPORT
                      (Signed Final 08/30/2011 [DATE])

          [facility]

 Order#:         [id number]
Procedures

 US OB FOLLOW UP                                       76816.1
Indications

 Poor obstetric history: Previous preterm delivery
 Previous cesarean section
 Poor obstetric history: Previous fetal growth
 restriction (FGR)
 Assess Fetal Growth / Estimated Fetal Weight
Fetal Evaluation

 Fetal Heart Rate:  138                         bpm
 Cardiac Activity:  Observed
 Presentation:      Cephalic
 Placenta:          Anterior, above cervical os
 P. Cord            Previously Visualized
 Insertion:

 Amniotic Fluid
 AFI FV:      Subjectively low-normal
 AFI Sum:     12.5    cm      33   %Tile     Larg Pckt:   5.24   cm
 RUQ:   5.24   cm    RLQ:    2.19   cm    LUQ:   2.75    cm   LLQ:    2.32   cm
Biometry

 BPD:     64.6  mm    G. Age:   26w 1d                CI:        64.16   70 - 86
                                                      FL/HC:      21.3   19.6 -

 HC:     259.5  mm    G. Age:   28w 2d        8  %    HC/AC:      1.10   0.99 -

 AC:     236.6  mm    G. Age:   28w 0d       20  %    FL/BPD:     85.4   71 - 87
 FL:      55.2  mm    G. Age:   29w 1d       43  %    FL/AC:      23.3   20 - 24

 Est. FW:    4435  gm    2 lb 10 oz      39  %
Gestational Age

 LMP:           28w 6d       Date:   02/09/11                 EDD:   11/16/11
 U/S Today:     27w 6d                                        EDD:   11/23/11
 Best:          28w 6d    Det. By:   LMP  (02/09/11)          EDD:   11/16/11
Anatomy

 Cranium:           Appears normal      Aortic Arch:       Previously seen
 Fetal Cavum:       Appears normal      Ductal Arch:       Previously seen
 Ventricles:        Appears normal      Diaphragm:         Appears normal
 Choroid Plexus:    Previously seen     Stomach:           Appears normal
 Cerebellum:        Previously seen     Abdomen:           Appears normal
 Posterior Fossa:   Previously seen     Abdominal Wall:    Previously seen
 Nuchal Fold:       Previously seen     Cord Vessels:      Previously seen
 Face:              Previously seen     Kidneys:           Appear normal
 Heart:             Appears normal      Bladder:           Appears normal
                    (4 chamber &
                    axis)
 RVOT:              Previously seen     Spine:             Previously seen
 LVOT:              Previously seen     Limbs:             Previously seen

 Other:     Fetus appears to be a female. Heels, 5th digit and Nasal
            bone previously visualized. Technically difficult due to
            fetal position.
Cervix Uterus Adnexa

 Cervical Length:   3.4       cm

 Cervix:       Closed. Normal appearance by transabdominal scan.

 Adnexa:     No abnormality visualized.
Impression

 Assigned GA is currently 28w 6d.   Fetal growth curve shows
 mild interval decline, with EFW currently at 39 %ile and AC at
 20 %ile.
 Amniotic fluid within normal limits, with AFI of 12.5 cm.
 Normal cervical length.
 No late developing fetal abnormalities seen involving
 visualized anatomy.
Recommendations

 Continued US follow-up of fetal growth in 4-6 weeks.

## 2013-10-04 ENCOUNTER — Other Ambulatory Visit (HOSPITAL_COMMUNITY)
Admission: RE | Admit: 2013-10-04 | Discharge: 2013-10-04 | Disposition: A | Discharge status: Home / Self Care | Payer: No Typology Code available for payment source | Source: Ambulatory Visit | Attending: Internal Medicine | Admitting: Internal Medicine

## 2013-10-04 ENCOUNTER — Encounter: Payer: Self-pay | Admitting: Internal Medicine

## 2013-10-04 ENCOUNTER — Ambulatory Visit: Payer: No Typology Code available for payment source | Attending: Internal Medicine | Admitting: Internal Medicine

## 2013-10-04 VITALS — BP 103/72 | HR 76 | Temp 98.3°F | Resp 16 | Ht 62.0 in | Wt 152.0 lb

## 2013-10-04 DIAGNOSIS — N760 Acute vaginitis: Secondary | ICD-10-CM | POA: Insufficient documentation

## 2013-10-04 DIAGNOSIS — Z113 Encounter for screening for infections with a predominantly sexual mode of transmission: Secondary | ICD-10-CM | POA: Insufficient documentation

## 2013-10-04 DIAGNOSIS — Z7189 Other specified counseling: Secondary | ICD-10-CM

## 2013-10-04 DIAGNOSIS — Z01419 Encounter for gynecological examination (general) (routine) without abnormal findings: Secondary | ICD-10-CM | POA: Insufficient documentation

## 2013-10-04 DIAGNOSIS — Z124 Encounter for screening for malignant neoplasm of cervix: Secondary | ICD-10-CM

## 2013-10-04 DIAGNOSIS — Z1151 Encounter for screening for human papillomavirus (HPV): Secondary | ICD-10-CM | POA: Insufficient documentation

## 2013-10-04 DIAGNOSIS — Z7689 Persons encountering health services in other specified circumstances: Secondary | ICD-10-CM

## 2013-10-04 LAB — CBC WITH DIFFERENTIAL/PLATELET
BASOS ABS: 0 10*3/uL (ref 0.0–0.1)
BASOS PCT: 0 % (ref 0–1)
EOS ABS: 0.2 10*3/uL (ref 0.0–0.7)
EOS PCT: 2 % (ref 0–5)
HEMATOCRIT: 42.2 % (ref 36.0–46.0)
Hemoglobin: 14.4 g/dL (ref 12.0–15.0)
Lymphocytes Relative: 36 % (ref 12–46)
Lymphs Abs: 2.9 10*3/uL (ref 0.7–4.0)
MCH: 26.8 pg (ref 26.0–34.0)
MCHC: 34.1 g/dL (ref 30.0–36.0)
MCV: 78.4 fL (ref 78.0–100.0)
MONO ABS: 0.4 10*3/uL (ref 0.1–1.0)
Monocytes Relative: 5 % (ref 3–12)
NEUTROS ABS: 4.6 10*3/uL (ref 1.7–7.7)
Neutrophils Relative %: 57 % (ref 43–77)
Platelets: 339 10*3/uL (ref 150–400)
RBC: 5.38 MIL/uL — ABNORMAL HIGH (ref 3.87–5.11)
RDW: 14.5 % (ref 11.5–15.5)
WBC: 8.1 10*3/uL (ref 4.0–10.5)

## 2013-10-04 LAB — COMPLETE METABOLIC PANEL WITH GFR
ALK PHOS: 86 U/L (ref 39–117)
ALT: 9 U/L (ref 0–35)
AST: 18 U/L (ref 0–37)
Albumin: 4.4 g/dL (ref 3.5–5.2)
BILIRUBIN TOTAL: 0.4 mg/dL (ref 0.2–1.2)
BUN: 9 mg/dL (ref 6–23)
CO2: 23 mEq/L (ref 19–32)
CREATININE: 0.73 mg/dL (ref 0.50–1.10)
Calcium: 9.5 mg/dL (ref 8.4–10.5)
Chloride: 103 mEq/L (ref 96–112)
GFR, Est African American: >89 mL/min
Glucose, Bld: 88 mg/dL (ref 70–99)
POTASSIUM: 4.9 meq/L (ref 3.5–5.3)
Sodium: 136 mEq/L (ref 135–145)
Total Protein: 7.6 g/dL (ref 6.0–8.3)

## 2013-10-04 LAB — POCT GLYCOSYLATED HEMOGLOBIN (HGB A1C): HEMOGLOBIN A1C: 5.6

## 2013-10-04 LAB — LIPID PANEL
CHOL/HDL RATIO: 2.8 ratio
CHOLESTEROL: 187 mg/dL (ref 0–200)
HDL: 68 mg/dL (ref >39)
LDL Cholesterol: 101 mg/dL — ABNORMAL HIGH (ref 0–99)
TRIGLYCERIDES: 90 mg/dL (ref <150)
VLDL: 18 mg/dL (ref 0–40)

## 2013-10-04 LAB — TSH: TSH: 2.177 u[IU]/mL (ref 0.350–4.500)

## 2013-10-04 NOTE — Progress Notes (Signed)
Pt is here to establish care. Pt is requesting a physical and a Pap smear. Pt has an interpretor today.

## 2013-10-04 NOTE — Progress Notes (Signed)
Patient ID: Ashley Garza, female   DOB: [birth year], 33 y.o.   MRN: [id number]   Ashley Garza, is a 33 y.o. female  [id number]  [id number]  DOB - [birth year]  CC:  Chief Complaint  Patient presents with  . Establish Care       HPI: Ashley Garza is a 33 y.o. female here today to establish medical care. She has no significant past medical history except for postpartum depression that has resolved. She is here today for Pap smear mainly. She has no complaint. No family history of cervical cancer, a grandmother died of stomach cancer, no history of breast cancer in the family. She has 2 children, lives with husband and kids. She does not smoke cigarette, she does not drink alcohol. Patient has No headache, No chest pain, No abdominal pain - No Nausea, No new weakness tingling or numbness, No Cough - SOB.  No Known Allergies Past Medical History  Diagnosis Date  . No pertinent past medical history    Current Outpatient Prescriptions on File Prior to Visit  Medication Sig Dispense Refill  . citalopram (CELEXA) 20 MG tablet Take 1 tablet (20 mg total) by mouth daily.  30 tablet  3   No current facility-administered medications on file prior to visit.   Family History  Problem Relation Age of Onset  . Cancer Brother     stomach  . Cancer Maternal Grandmother     stomach   History   Social History  . Marital Status: Single    Spouse Name: N/A    Number of Children: N/A  . Years of Education: N/A   Occupational History  . Not on file.   Social History Main Topics  . Smoking status: Never Smoker   . Smokeless tobacco: Never Used  . Alcohol Use: No  . Drug Use: No  . Sexual Activity: Yes    Birth Control/ Protection: IUD     Comment: paraguard placed 12/2011   Other Topics Concern  . Not on file   Social History Narrative  . No narrative on file    Review of Systems: Constitutional: Negative for fever, chills,  diaphoresis, activity change, appetite change and fatigue. HENT: Negative for ear pain, nosebleeds, congestion, facial swelling, rhinorrhea, neck pain, neck stiffness and ear discharge.  Eyes: Negative for pain, discharge, redness, itching and visual disturbance. Respiratory: Negative for cough, choking, chest tightness, shortness of breath, wheezing and stridor.  Cardiovascular: Negative for chest pain, palpitations and leg swelling. Gastrointestinal: Negative for abdominal distention. Genitourinary: Negative for dysuria, urgency, frequency, hematuria, flank pain, decreased urine volume, difficulty urinating and dyspareunia.  Musculoskeletal: Negative for back pain, joint swelling, arthralgia and gait problem. Neurological: Negative for dizziness, tremors, seizures, syncope, facial asymmetry, speech difficulty, weakness, light-headedness, numbness and headaches.  Hematological: Negative for adenopathy. Does not bruise/bleed easily. Psychiatric/Behavioral: Negative for hallucinations, behavioral problems, confusion, dysphoric mood, decreased concentration and agitation.    Objective:   Filed Vitals:   10/04/13 0939  BP: 103/72  Pulse: 76  Temp: 98.3 F (36.8 C)  Resp: 16    Physical Exam: Constitutional: Patient appears well-developed and well-nourished. No distress. HENT: Normocephalic, atraumatic, External right and left ear normal. Oropharynx is clear and moist.  Eyes: Conjunctivae and EOM are normal. PERRLA, no scleral icterus. Neck: Normal ROM. Neck supple. No JVD. No tracheal deviation. No thyromegaly. CVS: RRR, S1/S2 +, no murmurs, no gallops, no carotid bruit.  Pulmonary: Effort and breath sounds normal, no stridor, rhonchi, wheezes, rales.  Abdominal:  Soft. BS +, no distension, tenderness, rebound or guarding.  Musculoskeletal: Normal range of motion. No edema and no tenderness.  Lymphadenopathy: No lymphadenopathy noted, cervical, inguinal or axillary Neuro: Alert. Normal  reflexes, muscle tone coordination. No cranial nerve deficit. Skin: Skin is warm and dry. No rash noted. Not diaphoretic. No erythema. No pallor. Psychiatric: Normal mood and affect. Behavior, judgment, thought content normal. Pelvic examination: Normal female external genitalia. Central cervix, negative cervical motion tenderness  Lab Results  Component Value Date   WBC 9.1 10/31/2011   HGB 9.1* 10/31/2011   HCT 28.8* 10/31/2011   MCV 75.6* 10/31/2011   PLT 238 10/31/2011   No results found for this basename: CREATININE, BUN, NA, K, CL, CO2    No results found for this basename: HGBA1C   Lipid Panel  No results found for this basename: chol, trig, hdl, cholhdl, vldl, ldlcalc       Assessment and plan:   1. Pap smear for cervical cancer screening  - Cytology - PAP - Cervicovaginal ancillary only  2. Encounter to establish care  - CBC with Differential - COMPLETE METABOLIC PANEL WITH GFR - POCT glycosylated hemoglobin (Hb A1C) - Lipid panel - TSH - Urinalysis, Complete  Patient was counseled extensively about nutrition and exercise Return in about 1 year (around 10/05/2014), or if symptoms worsen or fail to improve, for Annual Physical.  Interpreter was used to communicate directly with patient for the entire encounter including providing detailed patient instructions.   The patient was given clear instructions to go to ER or return to medical center if symptoms don't improve, worsen or new problems develop. The patient verbalized understanding. The patient was told to call to get lab results if they haven't heard anything in the next week.     This note has been created with Education officer, environmental. Any transcriptional errors are unintentional.    Jeanann Lewandowsky, MD, MHA, FACP, FAAP [facility] [place], [place] [phone]   10/04/2013, 10:41 AM

## 2013-10-05 LAB — URINALYSIS, COMPLETE
BACTERIA UA: NONE SEEN
Bilirubin Urine: NEGATIVE
CASTS: NONE SEEN
CRYSTALS: NONE SEEN
GLUCOSE, UA: NEGATIVE mg/dL
HGB URINE DIPSTICK: NEGATIVE
KETONES UR: NEGATIVE mg/dL
Leukocytes, UA: NEGATIVE
Nitrite: NEGATIVE
PH: 6 (ref 5.0–8.0)
Protein, ur: NEGATIVE mg/dL
SPECIFIC GRAVITY, URINE: 1.019 (ref 1.005–1.030)
Squamous Epithelial / LPF: NONE SEEN
Urobilinogen, UA: 0.2 mg/dL (ref 0.0–1.0)

## 2013-10-05 LAB — CERVICOVAGINAL ANCILLARY ONLY
CHLAMYDIA, DNA PROBE: NEGATIVE
Neisseria Gonorrhea: NEGATIVE
Wet Prep (BD Affirm): NEGATIVE
Wet Prep (BD Affirm): NEGATIVE
Wet Prep (BD Affirm): POSITIVE — AB

## 2013-10-11 ENCOUNTER — Telehealth: Payer: Self-pay | Admitting: *Deleted

## 2013-10-11 DIAGNOSIS — N390 Urinary tract infection, site not specified: Secondary | ICD-10-CM

## 2013-10-11 MED ORDER — METRONIDAZOLE 500 MG PO TABS: 500.0000 mg | ORAL_TABLET | Freq: Two times a day (BID) | ORAL | Status: DC

## 2013-10-11 NOTE — Telephone Encounter (Signed)
Please call pt. With Spanish Interpreter in regards to results from last visit.

## 2013-10-11 NOTE — Telephone Encounter (Signed)
I spoke to the pt and informed her of her lab and pap results. I sent her antibiotic to [facility] she will pick up the rx today.

## 2013-10-11 NOTE — Telephone Encounter (Signed)
Message copied by Raynelle CharyWINFREE, Isobella Ascher R on Thu Oct 11, 2013 11:08 AM ------      Message from: Jeanann LewandowskyJEGEDE, OLUGBEMIGA E      Created: Wed Oct 10, 2013  3:00 PM       Please inform patient that all her laboratory tests are within normal limit. Her Pap smear is negative for malignancy but she has an infection with bacteria vaginosis, this is not a sexually transmitted disease and easily treated with antibiotics,      Bruce: Prescription metronidazole 500 mg tablet by mouth twice a day for 7 days. ------

## 2013-12-20 ENCOUNTER — Other Ambulatory Visit: Payer: Self-pay

## 2013-12-20 MED ORDER — SULFAMETHOXAZOLE-TMP DS 800-160 MG PO TABS: 1.0000 | ORAL_TABLET | Freq: Two times a day (BID) | ORAL | Status: DC

## 2013-12-20 MED ORDER — IBUPROFEN 800 MG PO TABS: 800.0000 mg | ORAL_TABLET | Freq: Four times a day (QID) | ORAL | Status: DC | PRN

## 2013-12-20 NOTE — Progress Notes (Unsigned)
Patient presents as a walk in today with complaints Of swelling to her right great toe Denies any injury Area around the nail has been swollen the past four days Per Dr Hyman HopesJegede we will treat with antibiotics and ibuprofen Prescription for Bactrim and ibuprofen sent to [facility]

## 2013-12-24 ENCOUNTER — Ambulatory Visit: Payer: No Typology Code available for payment source | Admitting: Internal Medicine

## 2013-12-25 ENCOUNTER — Ambulatory Visit: Payer: No Typology Code available for payment source

## 2014-03-22 ENCOUNTER — Ambulatory Visit: Payer: Self-pay | Attending: Internal Medicine

## 2014-05-06 ENCOUNTER — Encounter: Payer: Self-pay | Admitting: Internal Medicine

## 2014-10-18 ENCOUNTER — Ambulatory Visit: Payer: Self-pay | Attending: Internal Medicine

## 2014-11-20 ENCOUNTER — Other Ambulatory Visit: Payer: Self-pay | Admitting: Family Medicine

## 2014-12-26 ENCOUNTER — Ambulatory Visit: Payer: Self-pay | Attending: Internal Medicine | Admitting: Internal Medicine

## 2014-12-26 ENCOUNTER — Encounter: Payer: Self-pay | Admitting: Internal Medicine

## 2014-12-26 VITALS — BP 97/63 | HR 92 | Temp 98.1°F | Resp 18 | Ht 62.0 in | Wt 161.6 lb

## 2014-12-26 DIAGNOSIS — G4700 Insomnia, unspecified: Secondary | ICD-10-CM | POA: Insufficient documentation

## 2014-12-26 DIAGNOSIS — Z0000 Encounter for general adult medical examination without abnormal findings: Secondary | ICD-10-CM | POA: Insufficient documentation

## 2014-12-26 DIAGNOSIS — Z01419 Encounter for gynecological examination (general) (routine) without abnormal findings: Secondary | ICD-10-CM | POA: Insufficient documentation

## 2014-12-26 DIAGNOSIS — Z124 Encounter for screening for malignant neoplasm of cervix: Secondary | ICD-10-CM

## 2014-12-26 LAB — COMPLETE METABOLIC PANEL WITH GFR
ALBUMIN: 3.9 g/dL (ref 3.5–5.2)
ALT: 12 U/L (ref 0–35)
AST: 18 U/L (ref 0–37)
Alkaline Phosphatase: 94 U/L (ref 39–117)
BUN: 15 mg/dL (ref 6–23)
CALCIUM: 9.2 mg/dL (ref 8.4–10.5)
CHLORIDE: 104 meq/L (ref 96–112)
CO2: 27 mEq/L (ref 19–32)
Creat: 0.74 mg/dL (ref 0.50–1.10)
GFR, Est African American: >89 mL/min
GFR, Est Non African American: >89 mL/min
Glucose, Bld: 55 mg/dL — ABNORMAL LOW (ref 70–99)
POTASSIUM: 4.5 meq/L (ref 3.5–5.3)
SODIUM: 142 meq/L (ref 135–145)
TOTAL PROTEIN: 7.2 g/dL (ref 6.0–8.3)
Total Bilirubin: 0.3 mg/dL (ref 0.2–1.2)

## 2014-12-26 LAB — LIPID PANEL
CHOL/HDL RATIO: 4.1 ratio
CHOLESTEROL: 185 mg/dL (ref 0–200)
HDL: 45 mg/dL — ABNORMAL LOW (ref >=46)
LDL Cholesterol: 97 mg/dL (ref 0–99)
Triglycerides: 217 mg/dL — ABNORMAL HIGH (ref <150)
VLDL: 43 mg/dL — ABNORMAL HIGH (ref 0–40)

## 2014-12-26 LAB — CBC WITH DIFFERENTIAL/PLATELET
BASOS PCT: 0 % (ref 0–1)
Basophils Absolute: 0 10*3/uL (ref 0.0–0.1)
EOS PCT: 1 % (ref 0–5)
Eosinophils Absolute: 0.1 10*3/uL (ref 0.0–0.7)
HEMATOCRIT: 39.3 % (ref 36.0–46.0)
Hemoglobin: 12.7 g/dL (ref 12.0–15.0)
Lymphocytes Relative: 32 % (ref 12–46)
Lymphs Abs: 3.2 10*3/uL (ref 0.7–4.0)
MCH: 24.8 pg — ABNORMAL LOW (ref 26.0–34.0)
MCHC: 32.3 g/dL (ref 30.0–36.0)
MCV: 76.6 fL — AB (ref 78.0–100.0)
MONO ABS: 0.7 10*3/uL (ref 0.1–1.0)
MPV: 10.3 fL (ref 8.6–12.4)
Monocytes Relative: 7 % (ref 3–12)
NEUTROS ABS: 6 10*3/uL (ref 1.7–7.7)
Neutrophils Relative %: 60 % (ref 43–77)
Platelets: 332 10*3/uL (ref 150–400)
RBC: 5.13 MIL/uL — ABNORMAL HIGH (ref 3.87–5.11)
RDW: 16.8 % — ABNORMAL HIGH (ref 11.5–15.5)
WBC: 10 10*3/uL (ref 4.0–10.5)

## 2014-12-26 LAB — POCT GLYCOSYLATED HEMOGLOBIN (HGB A1C): HEMOGLOBIN A1C: 6

## 2014-12-26 MED ORDER — RAMELTEON 8 MG PO TABS: 8.0000 mg | ORAL_TABLET | Freq: Every day | ORAL | Status: DC

## 2014-12-26 NOTE — Progress Notes (Signed)
Patient is here for a physical with a pap smear. Patient reports that her right knee and her back hurts a lot. Pain rated at 7, described as "peice of ice inside knee" and burning pain for back . Pain is constant. Patient reports her back pain causes her not to be able to sit straight. Pain started about 5 years ago. Patient would like to talk about her sleep pattern. Patient reports that for the last 2 months she has only been able to sleep 2-3 hours.

## 2014-12-26 NOTE — Patient Instructions (Signed)

## 2014-12-26 NOTE — Progress Notes (Signed)
Patient ID: Ashley Garza, female   DOB: 1980/11/26, 34 y.o.   MRN: [id number]   Ashley Garza, is a 34 y.o. female  [id number]  [id number]  DOB - 08/05/1980  Chief Complaint  Patient presents with  . Annual Exam    with pap        Subjective:   Ashley Garza is a 34 y.o. female here today for a follow up visit. Patient has no significant medical history, here today for annual physical examination and Pap smear. She has no complaints today except for insomnia occasionally. She finds out that some nights she sleeps around 11 PM only to wake up around 2 AM and will not be able to go back to sleep. She denies being depressed at this time. She denies any suicidal ideation or thoughts. She denies anxiety. She lives at home with her husband and 2 children. She reports no activity of major concerns at home. Patient has No headache, No chest pain, No abdominal pain - No Nausea, No new weakness tingling or numbness, No Cough - SOB.  Problem  Annual Physical Exam    ALLERGIES: No Known Allergies  PAST MEDICAL HISTORY: Past Medical History  Diagnosis Date  . No pertinent past medical history     MEDICATIONS AT HOME: Prior to Admission medications   Medication Sig Start Date End Date Taking? Authorizing Provider  citalopram (CELEXA) 20 MG tablet Take 1 tablet (20 mg total) by mouth daily. Patient not taking: Reported on 12/26/2014 01/24/13   Charm Rings, MD  ibuprofen (ADVIL,MOTRIN) 800 MG tablet Take 1 tablet (800 mg total) by mouth every 6 (six) hours as needed. Patient not taking: Reported on 12/26/2014 12/20/13   Quentin Angst, MD  metroNIDAZOLE (FLAGYL) 500 MG tablet Take 1 tablet (500 mg total) by mouth 2 (two) times daily. Patient not taking: Reported on 12/26/2014 10/11/13   Quentin Angst, MD  ramelteon (ROZEREM) 8 MG tablet Take 1 tablet (8 mg total) by mouth at bedtime. 12/26/14   Quentin Angst, MD    sulfamethoxazole-trimethoprim (BACTRIM DS) 800-160 MG per tablet Take 1 tablet by mouth 2 (two) times daily. Patient not taking: Reported on 12/26/2014 12/20/13   Quentin Angst, MD     Objective:   Filed Vitals:   12/26/14 1110 12/26/14 1112  BP:  97/63  Pulse:  92  Temp:  98.1 F (36.7 C)  TempSrc:  Oral  Resp:  18  Height: 5\' 2"  (1.575 m)   Weight: 161 lb 9.6 oz (73.301 kg)   SpO2:  99%    Exam General appearance : Awake, alert, not in any distress. Speech Clear. Not toxic looking HEENT: Atraumatic and Normocephalic, pupils equally reactive to light and accomodation Neck: supple, no JVD. No cervical lymphadenopathy.  Chest:Good air entry bilaterally, no added sounds  CVS: S1 S2 regular, no murmurs.  Abdomen: Bowel sounds present, Non tender and not distended with no gaurding, rigidity or rebound. Extremities: B/L Lower Ext shows no edema, both legs are warm to touch Neurology: Awake alert, and oriented X 3, CN II-XII intact, Non focal  Pelvic Exam: Cervix normal in appearance, external genitalia normal, no adnexal masses or tenderness, no cervical motion tenderness, rectovaginal septum normal, uterus normal size, shape, and consistency and vagina normal without discharge    Data Review Lab Results  Component Value Date   HGBA1C 5.6 10/04/2013     Assessment & Plan   1. Pap smear for cervical cancer screening  - Cytology -  PAP - Cervicovaginal ancillary only  2. Annual physical exam  - CBC with Differential/Platelet - COMPLETE METABOLIC PANEL WITH GFR - POCT glycosylated hemoglobin (Hb A1C) - Lipid panel - Urinalysis, Complete - HIV antibody (with reflex)  3. Insomnia  - ramelteon (ROZEREM) 8 MG tablet; Take 1 tablet (8 mg total) by mouth at bedtime.  Dispense: 30 tablet; Refill: 1  Patient have been counseled extensively about nutrition and exercise Return in about 1 year (around 12/26/2015), or if symptoms worsen or fail to improve, for Annual  Physical.  The patient was given clear instructions to go to ER or return to medical center if symptoms don't improve, worsen or new problems develop. The patient verbalized understanding. The patient was told to call to get lab results if they haven't heard anything in the next week.   This note has been created with Education officer, environmental. Any transcriptional errors are unintentional.    Jeanann Lewandowsky, MD, MHA, CPE, FACP, FAAP [facility] [place], [place] [phone]   12/26/2014, 12:30 PM

## 2014-12-27 LAB — URINALYSIS, COMPLETE
Bacteria, UA: NONE SEEN
Bilirubin Urine: NEGATIVE
Casts: NONE SEEN
Crystals: NONE SEEN
GLUCOSE, UA: NEGATIVE mg/dL
Hgb urine dipstick: NEGATIVE
KETONES UR: NEGATIVE mg/dL
Leukocytes, UA: NEGATIVE
Nitrite: NEGATIVE
PH: 5 (ref 5.0–8.0)
PROTEIN: NEGATIVE mg/dL
SQUAMOUS EPITHELIAL / LPF: NONE SEEN
Specific Gravity, Urine: 1.011 (ref 1.005–1.030)
Urobilinogen, UA: 0.2 mg/dL (ref 0.0–1.0)

## 2014-12-27 LAB — CYTOLOGY - PAP
CHLAMYDIA, DNA PROBE: NEGATIVE
Neisseria Gonorrhea: NEGATIVE

## 2014-12-27 LAB — CERVICOVAGINAL ANCILLARY ONLY: Wet Prep (BD Affirm): NEGATIVE

## 2014-12-27 LAB — HIV ANTIBODY (ROUTINE TESTING W REFLEX): HIV 1&2 Ab, 4th Generation: NONREACTIVE

## 2014-12-30 ENCOUNTER — Telehealth: Payer: Self-pay | Admitting: *Deleted

## 2014-12-30 NOTE — Telephone Encounter (Signed)
Pt returning call, please f/u with spanish interpreter.

## 2014-12-30 NOTE — Telephone Encounter (Signed)
-----   Message from Olugbemiga E Jegede, MD sent at 12/27/2014  5:01 PM EDT ----- Please inform patient her laboratory test results are normal. Pap smears negative for malignancy. No infection. Her hemoglobin A1c 6.0% which places her at prediabetic range. Advised patient for low carbohydrate, and regular physical exercise at least 3 times a week 30 minutes each time. HIV test is negative as of 12/26/2014.  

## 2014-12-30 NOTE — Telephone Encounter (Signed)
PI [id number] placed call. Left HIPAA compliant voice mail for patient to return our call.

## 2014-12-30 NOTE — Telephone Encounter (Signed)
Pt returning nurses call

## 2014-12-30 NOTE — Telephone Encounter (Signed)
-----   Message from Quentin Angstlugbemiga E Jegede, MD sent at 12/27/2014  5:01 PM EDT ----- Please inform patient her laboratory test results are normal. Pap smears negative for malignancy. No infection. Her hemoglobin A1c 6.0% which places her at prediabetic range. Advised patient for low carbohydrate, and regular physical exercise at least 3 times a week 30 minutes each time. HIV test is negative as of 12/26/2014.

## 2015-01-01 NOTE — Telephone Encounter (Signed)
Patient called requesting to speak to nurse regarding lab results, please f/u with patient

## 2015-01-07 NOTE — Telephone Encounter (Signed)
PI id# [id number] used for interpretation. Patient given results and asking for diabetes education.  Walked patient to front and Deisy made appointment for patient to see Leotis ShamesLauren, RN for diabetes education.

## 2015-01-13 ENCOUNTER — Other Ambulatory Visit: Payer: Self-pay

## 2015-01-31 ENCOUNTER — Ambulatory Visit (HOSPITAL_BASED_OUTPATIENT_CLINIC_OR_DEPARTMENT_OTHER): Payer: Self-pay | Admitting: Clinical

## 2015-01-31 ENCOUNTER — Ambulatory Visit: Payer: Self-pay | Attending: Internal Medicine | Admitting: Pharmacist

## 2015-01-31 DIAGNOSIS — F329 Major depressive disorder, single episode, unspecified: Secondary | ICD-10-CM

## 2015-01-31 DIAGNOSIS — F418 Other specified anxiety disorders: Secondary | ICD-10-CM | POA: Insufficient documentation

## 2015-01-31 DIAGNOSIS — F32A Depression, unspecified: Secondary | ICD-10-CM

## 2015-01-31 DIAGNOSIS — F419 Anxiety disorder, unspecified: Principal | ICD-10-CM

## 2015-01-31 DIAGNOSIS — Z0000 Encounter for general adult medical examination without abnormal findings: Secondary | ICD-10-CM | POA: Insufficient documentation

## 2015-01-31 NOTE — Progress Notes (Signed)
ASSESSMENT: Pt currently experiencing symptoms of anxiety and depression. Pt needs to f/u with PCP and [facility]; would benefit from continued supportive counseling regarding coping with symptoms of anxiety and depression.  Stage of Change: contemplative  PLAN: 1. F/U with behavioral health consultant in one month 2. Psychiatric Medications: none. 3. Behavioral recommendation(s):   -Take daily walk -Consider reading over educational material regarding depression self-management goals SUBJECTIVE: Pt. referred by Dr Hyman Hopes for depressions of anxiety and depression:  Pt. reports the following symptoms/concerns: Pt states that she feels like she is a "not good enough mom" because she does not have the energy to play with children, and that she gets irritated easily. She says she wants to learn English, learn to drive, and get a job; that she would feel better about herself if she could be more independent.  Duration of problem: At least 3 years Severity: moderate  OBJECTIVE: Orientation & Cognition: Oriented x3. Thought processes normal and appropriate to situation. Mood: appropriate. Affect: appropriate Appearance: appropriate Risk of harm to self or others: no risk of harm to self or others Substance use: none Assessments administered: PHQ9-15/ GAD7-8  Diagnosis: Anxiety and depression CPT Code: F41.8 -------------------------------------------- Other(s) present in the room: Celene Skeen,  interpretor  Time spent with patient in exam room: 45 minutes

## 2015-01-31 NOTE — Patient Instructions (Addendum)
Deteccin de la diabetes tipo 2 (Screening for Type 2 Diabetes) La deteccin es una forma de diagnosticar la diabetes tipo 2 en personas que no tienen sntomas de la enfermedad, pero que probablemente la puedan desarrollar en el futuro. La diabetes puede provocar graves problemas de [place], [place] la deteccin precoz permite un tratamiento anticipado.  FACTORES DE RIESGO PARA LA DIABETES   Antecedentes familiares.  Enfermedades del pncreas.  Obesidad o sobrepeso.  Ciertos grupos raciales o tnicos:  Indios norteamericanos.  Isleos del Pacfico.  Hispanos.  Asiticos.  Afroamericanos.  Presin arterial elevada (hipertensin).  Antecedentes de diabetes durante el embarazo (diabetes gestational).  Dar a luz a un nio que pese ms de 9 libras (4,5 kg).  Sedentarismo.  Niveles elevados de colesterol o triglicridos.  La edad, especialmente despus de los 45 aos de edad. QUIENES DEBEN HACERSE ESTUDIOS  Adultos   Los adultos que no tienen factores de riesgo ni sntomas deben ser evaluados a Glass blower/designer de los 45 aos de edad. Si las pruebas son normales, se debe repetir cada 3 aos.  Los [employer] no tienen sntomas, pero tienen exceso de [place], deben ser examinados antes de los 45.  Los [employer] no tienen sntomas, pero tienen uno o ms factores de [place].  Los adultos que tienen un nivel de A1C (3 meses promedio de Event organiser) superior a 5,7% o los que tuvieron una alteracin en la tolerancia a la glucosa (IGT) o alteracin de la glucosa en ayunas (IFG) en una prueba anterior.  Las mujeres embarazadas con o sin factores de Chief of Staff.  Las mujeres que dieron a luz y sufrieron diabetes gestacional. Esta prueba se debe hacer de 6 a 12 semanas despus del nacimiento del nio. Nios y United Stationers nios y adolescentes deben ser evaluados para la diabetes tipo 2 si tienen sobrepeso y tienen 2 de los siguientes factores de riesgo:  Brewing technologist de diabetes tipo  2.  Pertenecer a Consulting civil engineer o grupo tnico de Conservator, museum/gallery.  Tener signos de resistencia a la insulina o enfermedades asociadas con la resistencia a la insulina.  Tener una madre que tuvo diabetes gestacional cuando estaba embarazada del [place].  La evaluacin debe comenzar a los 10 aos o al inicio de la pubertad, lo que ocurra primero debe repetirse cada 2 aos. EVALUACIN  Durante la evaluacin, su mdico puede:   Hacer preguntas acerca de su salud en general. Incluir preguntas sobre la salud de los miembros cercanos de la familia.  Preguntar acerca de sntomas similares a la diabetes.  Har un examen fsico.  Indicar algunas pruebas que pueden ser:  Medicin de glucosa plasmtica en ayunas. Medicin del nivel de Banker. Se realiza despus de no comer, slo tomando agua (ayuno) durante 8 horas.  Pruebas al azar de glucosa en sangre. Este [employer] se puede hacer sin necesidad de Conservation officer, nature.  Prueba oral de tolerancia a la glucosa. Este es un anlisis de sangre que se hace en 2 partes. En Restaurant manager, fast food, se toma [place]is Poag de sangre despus de haber ayunado. Luego, se toma otra muestra despus de beber un lquido que contiene una gran cantidad de International aid/development worker.  Prueba de A1c. Esta prueba muestra cunta glucosa hubo en la sangre durante los ltimos 2 a 3 meses. Document Released: 03/03/2011 Document Revised: 09/13/2011 [facility] Patient Information 2015 [place], [place]. This information is not intended to replace advice given to you by your health care provider. Make sure you discuss any questions  you have with your health care provider. La diabetes mellitus y los alimentos (Diabetes Mellitus and Food) Es importante que controle su nivel de azcar en la sangre (glucosa). El nivel de glucosa en sangre depende en gran medida de lo que usted come. Comer alimentos saludables en las cantidades [place] a lo largo del Futures trader, aproximadamente a la misma hora [employer], lo ayudar a  Chief Operating Officer su nivel de Event organiser. Tambin puede ayudarlo a retrasar o Fish farm manager de la diabetes mellitus. Comer de [employer] saludable incluso puede ayudarlo a Event organiser de presin arterial y a Barista o Pharmacologist un peso saludable.  CMO PUEDEN AFECTARME LOS ALIMENTOS? Carbohidratos Los carbohidratos afectan el nivel de glucosa en sangre ms que cualquier otro tipo de alimento. El nutricionista lo ayudar a Chief Strategy Officer cuntos carbohidratos puede consumir en cada comida y ensearle a contarlos. El recuento de carbohidratos es importante para mantener la glucosa en sangre en un nivel saludable, en especial si utiliza insulina o toma determinados medicamentos para la diabetes mellitus. Alcohol El alcohol puede provocar disminuciones sbitas de la glucosa en sangre (hipoglucemia), en especial si utiliza insulina o toma determinados medicamentos para la diabetes mellitus. La hipoglucemia es una afeccin que puede poner en peligro la vida. Los sntomas de la hipoglucemia (somnolencia, mareos y Administrator) son similares a los sntomas de haber consumido mucho alcohol.  Si el mdico lo autoriza a beber alcohol, hgalo con moderacin y siga estas pautas:  Las mujeres no deben beber ms de un trago por da, y los hombres no deben beber ms de dos tragos por Futures trader. Un trago es igual a:  12 onzas (355 ml) de cerveza  5 onzas de vino (150 ml) de vino  1,5onzas (45ml) de bebidas espirituosas  No beba con el estmago vaco.  Mantngase hidratado. Beba agua, gaseosas dietticas o t helado sin azcar.  Las gaseosas comunes, los jugos y otros refrescos podran contener muchos carbohidratos y se Heritage manager. QU ALIMENTOS NO SE RECOMIENDAN? Cuando haga las elecciones de alimentos, es importante que recuerde que todos los alimentos son distintos. Algunos tienen menos nutrientes que otros por porcin, aunque podran tener la misma cantidad de caloras o carbohidratos. Es difcil  darle al cuerpo lo que necesita cuando consume alimentos con menos nutrientes. Estos son algunos ejemplos de alimentos que debera evitar ya que contienen muchas caloras y carbohidratos, pero pocos nutrientes:  Neurosurgeon trans (la mayora de los alimentos procesados incluyen grasas trans en la etiqueta de Informacin nutricional).  Gaseosas comunes.  Jugos.  Caramelos.  Dulces, como tortas, pasteles, rosquillas y [place].  Comidas fritas. QU ALIMENTOS PUEDO COMER? Consuma alimentos ricos en nutrientes, que nutrirn el cuerpo y lo mantendrn saludable. Los alimentos que debe comer tambin dependern de varios factores, como:  Las caloras que necesita.  Los medicamentos que toma.  Su peso.  El nivel de glucosa en [place].  El [place] de presin arterial.  El nivel de colesterol. Tambin debe consumir una variedad de [place], como:  Protenas, como carne, aves, pescado, tofu, frutos secos y semillas (las protenas de [place] magros son mejores).  Nils Pyle.  Verduras.  Productos lcteos, como [place], queso y yogur (descremados son mejores).  Panes, granos, pastas, cereales, arroz y frijoles.  Grasas, como aceite de [place], [place] sin grasas trans, aceite de canola, aguacate y [place]. TODOS LOS QUE PADECEN DIABETES MELLITUS TIENEN EL MISMO PLAN DE COMIDAS? Dado que todas las personas que padecen diabetes mellitus son distintas, no hay un solo plan  de comidas que funcione para todos. Es muy importante que se rena con un nutricionista que lo ayudar a crear un plan de comidas adecuado para usted. Document Released: 09/28/2007 Document Revised: 06/26/2013 [facility] Patient Information 2015 [place], [place]. This information is not intended to replace advice given to you by your health care provider. Make sure you discuss any questions you have with your health care provider. Diabetes y Doroteo Glassman fsica (Diabetes and Exercise) Hacer actividad fsica con regularidad es muy  importante. No se trata solo de [employer]. Tiene muchos otros beneficios, como por ejemplo:  Mejorar el estado fsico, la flexibilidad y la resistencia.  Aumenta la densidad sea.  Ayuda a Art gallery manager.  Disminuye la Art gallery manager.  Aumenta la fuerza muscular.  Reduce el estrs y las tensiones.  Mejora el estado de salud general. Las personas diabticas que realizan actividad fsica tienen beneficios adicionales debido al ejercicio:  Reduce el apetito.  El organismo mejora el uso del azcar (glucosa) de la [place].  Ayuda a disminuir o Engineer, maintenance (IT).  Disminuye la presin arterial.  Ayuda a disminuir los lpidos en la sangre (colesterol y triglicridos).  El organismo mejora el uso de la insulina porque:  Aumenta la sensibilidad del organismo a la insulina.  Reduce las necesidades de insulina del organismo.  Disminuye el riesgo de enfermedad cardaca por la actividad fsica ya que  disminuye el colesterol y [employer] triglicridos.  Aumenta los niveles de colesterol bueno (como las lipoprotenas de alta densidad [HDL]) en el organismo.  Disminuye los niveles de glucosa en la [place]. SU PLAN DE ACTIVIDAD  Elija una actividad que disfrute y establezca objetivos realistas. Su mdico o educador en diabetes podrn ayudarlo a encontrar una actividad que lo beneficie. Haga ejercicio regularmente como se lo haya indicado el mdico. Esto incluye:  Hacer entrenamiento de [employer] a la semana, como flexiones, sentadillas, levantar peso o usar bandas de resistencia.  Practicar de ejercicios cardiovasculares cada semana, como caminar, correr o hacer algn deporte.  Mantenerse activo y no permanecer inactivo durante ms de seguidos. Los perodos cortos de [place] tambin son beneficiosos. Tres sesiones de a lo largo del da son tan beneficiosas como una sola sesin de . Estas son algunas ideas  para los ejercicios:  Lleve a Multimedia programmer.  Utilice las Microbiologist del ascensor.  Baile su cancin favorita.  Haga los ejercicios de un video de ejercicios.  Haga sus ejercicios favoritos con Leisure centre manager. RECOMENDACIONES PARA REALIZAR EJERCICIOS CUANDO SE TIENE DIABETES TIPO 1 O TIPO 2   Controle la glucosa en la sangre antes de comenzar. Si el nivel de glucosa en la sangre es de ms de 240 mg/dl, controle las cetonas en la [place]. No haga actividad fsica si hay cetonas.  Evite inyectarse insulina en las zonas del cuerpo que ejercitar. Por ejemplo, evite inyectarse insulina en:  Los brazos, si juega al tenis.  Las piernas, si corre.  Lleve un registro de:  Los alimentos que consume antes y despus de Tour manager.  Los momentos esperables de picos de accin de la insulina.  Los niveles de glucosa en la sangre antes y despus de hacer ejercicios.  El tipo y cantidad de [place] fsica que Biomedical engineer.  Revise los registros con su mdico. El mdico lo ayudar a Environmental education officer pautas para ajustar la cantidad de alimento y las cantidades de insulina antes y despus de Radio producer ejercicios.  Si toma insulina o  agentes hipoglucemiantes por va oral, observe si hay signos y sntomas de hipoglucemia. Entre los que se incluyen:  Mareos.  Temblores.  Sudoracin.  Escalofros.  Confusin.  Beba gran cantidad de agua mientras hace ejercicios para evitar la deshidratacin o los golpes de Airline pilot. Durante la actividad fsica se pierde agua corporal que se debe reponer.  Comente con su mdico antes de comenzar un programa de actividad fsica para verificar que sea seguro para usted. Recuerde, cualquier actividad es mejor que ninguna. Document Released: 07/11/2007 Document Revised: 11/05/2013 [facility] Patient Information 2015 [place], [place]. This information is not intended to replace advice given to you by your health care provider. Make sure you discuss any questions you have  with your health care provider. Diabetes mellitus tipo2 (Type 2 Diabetes Mellitus) La diabetes mellitus tipo2, generalmente denominada diabetes tipo2, es una enfermedad prolongada (crnica). En la diabetes tipo2, el pncreas no produce suficiente insulina (una hormona), las clulas son menos sensibles a la insulina que se produce (resistencia a la insulina), o ambos. Normalmente, la [employer] azcares de los alimentos a las clulas de los tejidos. Las clulas de los tejidos [employer] Corporation azcares para Psychiatrist. La falta de insulina o la falta de una respuesta normal a la insulina hace que el exceso de azcar se acumule en la sangre en lugar de Customer service manager en las clulas de los tejidos. Como resultado, se producen niveles altos de Banker (hiperglucemia). El [address] de los niveles altos de azcar (glucosa) puede causar muchas complicaciones.  La diabetes tipo2 antes tambin se denominaba diabetes del [place], pero puede ocurrir a Actuary.  FACTORES DE RIESGO  Una persona tiene mayor predisposicin a desarrollar diabetes tipo 2 si alguien en su familia tiene la enfermedad y tambin tiene uno o ms de los siguientes factores de riesgo principales:  Sobrepeso.  Estilo de vida sedentario.  Una historia de consumo constante de alimentos de altas caloras. Mantener un peso saludable y realizar actividad fsica regular puede reducir la probabilidad de desarrollar diabetes tipo 2. SNTOMAS  Una persona con diabetes tipo 2 no presenta sntomas en un principio. Los sntomas de la diabetes tipo 2 aparecen lentamente. Los sntomas son:  Aumento de la sed (polidipsia).  Aumento de la miccin (poliuria).  Orina con ms frecuencia durante la noche (nocturia).  Prdida de peso. La prdida de peso puede ser muy rpida.  Infecciones frecuentes y recurrentes.  Cansancio (fatiga).  Debilidad.  Cambios en la visin, como visin borrosa.  Olor a Print production planner.  Dolor abdominal.  Nuseas o vmitos.  Cortes o moretones que tardan en sanarse.  Hormigueo o adormecimiento de las manos y los pies. DIAGNSTICO Con frecuencia la diabetes tipo 2 no se diagnostica hasta que se presentan las complicaciones de la diabetes. La diabetes tipo 2 se diagnostica cuando los sntomas o las complicaciones se presentan y cuando aumentan los niveles de glucosa en la [place]. El nivel de glucosa en la sangre puede controlarse en uno o ms de los siguientes anlisis de sangre:  Medicin de glucosa en la sangre en [place]. No se le permitir comer durante al menos 8 horas antes de que se tome [place] de [place].  Pruebas al azar de glucosa en la sangre. El nivel de glucosa en la sangre se controla en cualquier momento del da sin importar el momento en que haya comido.  Prueba de A1c (hemoglobina glucosilada) Una prueba de A1c proporciona informacin sobre el control de la glucosa  en la sangre durante los ltimos 3 meses.  Prueba de tolerancia a la glucosa oral (PTGO). La glucosa en la sangre se mide despus de no haber comido (ayunas) durante dos horas y despus de beber una bebida que contenga glucosa. TRATAMIENTO   Usted puede necesitar administrarse insulina o medicamentos para la diabetes todos los das para [employer] niveles de glucosa en la sangre en el rango deseado.  Si [place] insulina, tal vez necesite ajustar la dosis segn los carbohidratos que haya consumido en cada comida o colacin. El objetivo del tratamiento es mantener el nivel de azcar en la sangre previo a comer (glucosa preprandial) entre 49 y /dl. INSTRUCCIONES PARA EL CUIDADO EN EL HOGAR   Controle su nivel de hemoglobina A1c dos veces al ao.  Contrlese a diario Air cabin crew de glucosa en la sangre segn las indicaciones de su mdico.  Supervise las cetonas en la orina cuando est enferma y segn las indicaciones de su mdico.  Tome el medicamento para la diabetes o  adminstrese insulina segn las indicaciones de su mdico para Radio producer nivel de glucosa en la sangre en el rango deseado.  Nunca se quede sin medicamento para la diabetes o sin insulina. Es necesario que la reciba [employer].  Si [place] insulina, tal vez deba ajustar la cantidad de insulina administrada segn los carbohidratos consumidos. Los hidratos de carbono pueden aumentar los niveles de glucosa en la sangre, pero deben incluirse en su dieta. Los hidratos de carbono aportan vitaminas, minerales y [place] que son [place] parte esencial de una dieta saludable. Los hidratos de carbono se encuentran en frutas, verduras, cereales integrales, productos lcteos, legumbres y alimentos que contienen azcares aadidos.  Consuma alimentos saludables. Programe una cita con un nutricionista certificado para que lo ayude a Chief Executive Officer de alimentacin adecuado para usted.  Baje de peso si es necesario.  Lleve una tarjeta de alerta mdica o use una pulsera o medalla de alerta mdica.  Lleve con usted una colacin de 15gramos de hidratos de carbono en todo momento para controlar los niveles bajos de glucosa en la sangre (hipoglucemia). Algunos ejemplos de colaciones de 15gramos de hidratos de carbono son los siguientes:  Tabletas de glucosa, 3 o 4.  Gel de glucosa, tubo de 15 gramos.  Pasas de uva, 2 cucharadas (24 gramos).  Caramelos de goma, 6.  Galletas de [place], 8.  Gaseosa comn, 4onzas ( ).  Pastillas de goma, 9.  Reconocer la hipoglucemia. La hipoglucemia se produce cuando los niveles de glucosa en la sangre son de 70 mg/dl o menos. El riesgo de hipoglucemia aumenta durante el ayuno o cuando se saltea las comidas, durante o despus de Education officer, environmental ejercicio intenso y [place] duerme. Los sntomas de hipoglucemia son:  Temblores o sacudidas.  Disminucin de la capacidad de concentracin.  Sudoracin.  Aumento de la frecuencia cardaca.  Dolor de  [place].  Sequedad en la boca.  Hambre.  Irritabilidad.  Ansiedad.  Sueo agitado.  Alteracin del habla o de la coordinacin.  Confusin.  Tratar la hipoglucemia rpidamente. Si usted est alerta y puede tragar con seguridad, siga la regla de 15/15 que consiste en:  [employer] 15 y 20gramos de glucosa de accin rpida o carbohidratos. Las opciones de accin rpida son un gel de glucosa, tabletas de glucosa, o 4 onzas (120 ml) de jugo de frutas, gaseosa comn, o leche baja en grasa.  Compruebe su nivel de glucosa en la sangre 15 minutos despus de tomar la glucosa.  Tome entre 15 y 20 gramos ms de glucosa si el nivel de glucosa en la sangre todava es de 70mg /dl o inferior.  Ingiera una comida o una colacin en el lapso de 1 hora una vez que los niveles de glucosa en la sangre vuelven a la normalidad.  Est atento a si siente mucha sed u orina con mayor frecuencia, porque son signos tempranos de hiperglucemia. El reconocimiento temprano de la hiperglucemia permite un tratamiento oportuno. Trate la hiperglucemia segn le indic su mdico.  Haga, al menos, de actividad fsica moderada a la semana, distribuidos en, por lo menos, 3das a la semana o como lo indique su mdico. Adems, debe realizar ejercicios de resistencia por lo menos 2veces a la semana o como lo indique su mdico. Trate de no permanecer inactivo durante ms de seguidos.  Ajuste su medicamento y la ingesta de alimentos, segn sea necesario, si inicia un nuevo ejercicio o deporte.  Siga su plan para los [address] de enfermedad cuando no puede comer o beber como de [place].  No consuma ningn producto que contenga tabaco, como cigarrillos, tabaco de Theatre manager o Administrator, Civil Service. Si necesita ayuda para dejar de fumar, hable con el mdico.  Limite el consumo de alcohol a no ms de 1 medida por da en las mujeres no embarazadas y 2 medidas en los hombres. Debe beber alcohol solo mientras come.  Hable con su mdico acerca de si el alcohol es seguro para usted. Informe a su mdico si bebe alcohol varias veces a la semana.  Concurra a todas las visitas de control como se lo haya indicado el mdico. Esto es importante.  Programe un examen de la vista poco despus del diagnstico de diabetes tipo 2 y luego anualmente.  Realice diariamente el cuidado de la piel y de los pies. Examine su piel y los pies diariamente para ver si tiene cortes, moretones, enrojecimiento, problemas en las uas, sangrado, ampollas o Advertising account planner. Su mdico debe hacerle un examen de los pies una vez por ao.  Cepllese los dientes y encas por lo menos dos veces al da y use hilo dental al menos una vez por da. Concurra regularmente a las visitas de control con el dentista.  Comparta su plan de control de diabetes en el trabajo o en la escuela.  Mantngase al da con las vacunas. Se recomienda que las [employer] de [id number] con diabetes se apliquen la vacuna contra la neumona. En algunos casos, pueden administrarse dos inyecciones separadas. Pregntele al mdico si tiene la vacuna contra la neumona al da.  Aprenda a Dealer.  Obtenga la mayor cantidad posible de informacin sobre la diabetes y solicite ayuda siempre que sea necesario.  Busque programas de rehabilitacin y participe en ellos para mantener o mejorar su independencia y su calidad de vida. Solicite la derivacin a fisioterapia o terapia ocupacional si se le [employer] o la mano, o tiene problemas para asearse, vestirse, comer, o durante la [place] fsica. SOLICITE ATENCIN MDICA SI:   No puede comer alimentos o beber por ms de 6 horas.  Tuvo nuseas o ha vomitado durante ms de 6 horas.  Su nivel de glucosa en la sangre es mayor de 240 mg/dl.  Presenta algn cambio en el estado mental.  Desarrolla una enfermedad grave adicional.  Tuvo diarrea durante ms de 6 horas.  Ha estado enfermo o ha tenido fiebre durante un par  [address] y no mejora.  Siente dolor al practicar cualquier actividad fsica.  SOLICITE ATENCIN MDICA DE INMEDIATO SI:  Tiene dificultad para respirar.  Tiene niveles de cetonas moderados a altos. ASEGRESE DE QUE:  Comprende estas instrucciones.  Controlar su afeccin.  Recibir ayuda de inmediato si no mejora o si empeora. Document Released: 06/21/2005 Document Revised: 11/05/2013 [facility] Patient Information 2015 [place], [place]. This information is not intended to replace advice given to you by your health care provider. Make sure you discuss any questions you have with your health care provider.  Recuento bsico de carbohidratos para la diabetes mellitus (Basic Carbohydrate Counting for Diabetes Mellitus) El recuento de carbohidratos es un mtodo destinado a calcular la cantidad de carbohidratos en la dieta. El consumo de carbohidratos aumenta naturalmente el nivel de azcar (glucosa) en la sangre, por lo que es importante que sepa la cantidad que debe incluir en cada comida. El recuento de carbohidratos ayuda a Futures trader de glucosa en la sangre dentro de los lmites normales. La cantidad permitida de carbohidratos es diferente para cada persona. Un nutricionista puede ayudarlo a calcular la cantidad adecuada para usted. Una vez que sepa la cantidad de carbohidratos que puede consumir, podr calcular los carbohidratos de los alimentos que desea comer. Los siguientes alimentos incluyen carbohidratos:  Granos, como panes y cereales.  Frijoles secos y productos con soja.  Vegetales almidonados, como papas, guisantes y maz.  Nils Pyle y jugos de frutas.  Leche y Dentist.  Dulces y bocadillos, como pastel, galletas, caramelos, papas fritas de bolsa, refrescos y bebidas frutales con azcar. RECUENTO DE CARBOHIDRATOS [employer] de calcular los carbohidratos de los alimentos. Puede usar cualquiera de [address] o [place] combinacin de [place]. Leer la etiqueta de informacin  nutricional de los alimentos envasados La informacin nutricional es una etiqueta incluida en casi todas las bebidas y los alimentos envasados de los [place]. Indica el tamao de la porcin de ese alimento o bebida e informacin sobre los nutrientes de cada porcin, incluso los gramos (g) de carbohidratos por porcin.  Decida la cantidad de porciones que comer o tomar de este alimento o bebida. Multiplique la cantidad de porciones por el nmero de gramos de carbohidratos indicados en la etiqueta para esa porcin. El total ser la cantidad de carbohidratos que consumir al comer ese alimento o tomar esa bebida. Conocer las porciones estndar de los alimentos Cuando coma alimentos no envasados o que no incluyan la informacin nutricional en la etiqueta, deber medir las porciones para poder calcular la cantidad de carbohidratos. Una porcin de la mayora de los alimentos ricos en carbohidratos contiene alrededor de 15g de carbohidratos. La siguiente [employer] tamaos de porcin de los alimentos ricos en carbohidratos que contienen alrededor de 15g de carbohidratos por porcin:   1rebanada de pan (1oz) o 1tortilla de seis pulgadas.  panecillo de hamburguesa o bollito tipo ingls.  4a 6galletas.   de taza de cereal sin azcar y seco.   taza de cereal caliente.   de taza de arroz o pastas.  taza de pur de papas o de una papa grande al horno.  1taza de frutas frescas o una fruta pequea.  taza de frutas o jugo de frutas enlatados o congelados.  1 taza [employer].   de taza de yogur descremado sin ningn agregado o de yogur endulzado con edulcorante artificial.  taza de vegetales almidonados, como guisantes, maz o papas, o de frijoles secos cocidos. Decida la cantidad de porciones Advertising copywriter. Multiplique la cantidad de porciones por 15 (los gramos de carbohidratos  en esa porcin). Por ejemplo, si come 2tazas de fresas, habr comido 2porciones y  30g de carbohidratos (2porciones x 15g = 30g). Para las comidas como sopas y guisos, en las que se mezcla ms de un alimento, deber [employer] carbohidratos de cada alimento incluido. EJEMPLO DE RECUENTO DE CARBOHIDRATOS Ejemplo de cena  3 onzas de pechugas de pollo.   de taza de arroz integral.   taza de maz.  1 taza de [place].  1 taza de fresas con crema batida sin azcar. Clculo de carbohidratos Paso 1: Identifique los alimentos que contienen carbohidratos:   Arroz.  Maz.  Leche.  Jinny Sanders. Paso 2: Calcule el nmero de porciones que consumir de cada uno:   2 porciones de Surveyor, minerals.  1 porcin de maz.  1 porcin de leche.  1 porcin de fresas. Paso 3: Multiplique cada una de esas porciones por 15g:   2 porciones de arroz x 15 g = 30 g.  1 porcin de maz x 15 g = 15 g.  1 porcin de leche x 15 g = 15 g.  1 porcin de fresas x 15 g = 15 g. Paso 4: Sume todas las cantidades para Artist total de gramos de carbohidratos consumidos: 30 g + 15 g + 15 g + 15 g = 75 g. Document Released: 09/13/2011 Document Revised: 11/05/2013 [facility] Patient Information 2015 [place], [place]. This information is not intended to replace advice given to you by your health care provider. Make sure you discuss any questions you have with your health care provider.

## 2015-01-31 NOTE — Progress Notes (Signed)
S:    Patient presents for pre-diabetes education. Patient is Spanish-speaking. Jomarie Longs, our in person Spanish interpreter, was presented for the entire visit.   Patient is not on any diabetes medications at this time.   Patient reported dietary habits: shakes (papaya and water), tortillas with beef, chicken, and cheese. Drinks water throughout the day. Would like to use almond milk for her shakes. Patient would like to see a nutritionist - she used to see one in [place] and found it to be very helpful.   Patient reported exercise habits: denies exercise at this time. Denies any issues that would prevent her from exercising. She tried to ride a bike the other week but fell off of it. She has a partner with diabetes and she believes that they can start exercising together.      O:  . Lab Results  Component Value Date   HGBA1C 6.0 12/26/2014     A/P: Pre-diabetes currently uncontrolled based on A1c of 6.0.  Currently not on any diabetes medication and is working to lower blood glucose through diet and exercise. Control is suboptimal due to dietary indiscretion and lack of physical activity. Discussed diabetes and pre-diabetes. Educated patient on diet, such as decreasing carb and sugar intake, even if that is just cutting back by one tortilla at each meal. Also encouraged patient to exercise, and to try to walk every day to start off with and then move into more strenuous exercise. Patient to exercise with partner to increase likelihood she will exercise. Will ask Dr. Hyman Hopes to refer patient to [facility].   Written educational information provided in Spanish - includes information on diabetes, diet, and exercise.  Follow up with Dr. Hyman Hopes in 2017 as directed, or sooner if needed. Total time in face to face counseling 20 minutes.

## 2015-02-16 ENCOUNTER — Other Ambulatory Visit: Payer: Self-pay | Admitting: Internal Medicine

## 2015-02-16 DIAGNOSIS — R7303 Prediabetes: Secondary | ICD-10-CM

## 2015-02-28 ENCOUNTER — Other Ambulatory Visit: Payer: Self-pay

## 2015-03-28 ENCOUNTER — Encounter: Payer: Self-pay | Attending: Internal Medicine | Admitting: Skilled Nursing Facility1

## 2015-03-28 ENCOUNTER — Encounter: Payer: Self-pay | Admitting: Skilled Nursing Facility1

## 2015-03-28 VITALS — Ht 61.0 in | Wt 163.0 lb

## 2015-03-28 DIAGNOSIS — Z713 Dietary counseling and surveillance: Secondary | ICD-10-CM | POA: Insufficient documentation

## 2015-03-28 DIAGNOSIS — R7309 Other abnormal glucose: Secondary | ICD-10-CM | POA: Insufficient documentation

## 2015-03-28 NOTE — Progress Notes (Signed)
  Medical Nutrition Therapy:  Appt start time: 0900 end time:  1000.   Assessment:  Primary concerns today: referred for prediabetes. Pt states her phsyicain told her she is overweight and has prediabetes. Pt states she Wants to lose 20-30 pounds. Diet XB:JYNWGNF eating dinner to lose weight. Pts Usual weight: 128 pounds. Pt has had 2 pregnancies 8 and 13 years ago. Pts sleep: awake from 2-5am, Goes to bed at 9pm been going on for 1 year, Wakes up at 6am, And then back to sleep for 2 hours. Pt states she eats at the table without distraction. Pt states she was raised by her grandmother who was very restrictive and did not allow her to play or dance only school and house work. Pt states she does not eat fruit or vegetables because she does not like them. Pts A1C 6.0.  Preferred Learning Style:   No preference indicated   Learning Readiness:   Contemplating   DIETARY INTAKE:  Usual eating pattern includes 3 meals and 0 snacks per day.  Everyday foods include tortilla.  Avoided foods include fruit and vegetable.    24-hr recall:  B ( AM): banana smoothie with milk Snk ( AM): none L ( PM): meat or eggs with tortilla Snk ( PM): none D ( PM): tortilla and meat Snk ( PM): none Beverages: water  Usual physical activity: ADL's  Estimated energy needs: 1800 calories 200 g carbohydrates 135 g protein 50 g fat  Progress Towards Goal(s):  In progress.   Nutritional Diagnosis:  NB-1.1 Food and nutrition-related knowledge deficit As related to no prior nutrition education from a nutrtiion professional.  As evidenced by pt report, 24 hour recall, A1C 6, BMI 30.    Intervention:  Nutrition counseling for prediabetes. Dietitian educated the pt on blood glucose/carbohydrates, balanced/varies meals, and the importance of varied meals.  Teaching Method Utilized:  Visual Auditory Hands on  Handouts given during visit include:  MyPlate  In depth MyPlate  Carb card  Barriers to  learning/adherence to lifestyle change: lack of interest in fruits and vegetables  Demonstrated degree of understanding via:  Teach Back   Monitoring/Evaluation:  Dietary intake, exercise, A1C, and body weight in 2 month(s).

## 2015-04-21 ENCOUNTER — Ambulatory Visit: Payer: Self-pay

## 2015-04-25 ENCOUNTER — Ambulatory Visit: Payer: Self-pay | Attending: Internal Medicine

## 2015-06-02 ENCOUNTER — Ambulatory Visit: Payer: Self-pay | Admitting: Skilled Nursing Facility1

## 2015-06-17 ENCOUNTER — Telehealth: Payer: Self-pay | Admitting: Internal Medicine

## 2015-06-17 NOTE — Telephone Encounter (Signed)
Pt. Came in to drop off a letter to PCP stating if she can be prescribed an antidepressant medication. Please f/u

## 2015-06-26 ENCOUNTER — Telehealth: Payer: Self-pay | Admitting: Internal Medicine

## 2015-06-26 NOTE — Telephone Encounter (Signed)
Advised patient to call on Jan the 3rd at 9am to make an appt. pcp would like to see pt to discuss anti-depressant medications.

## 2015-07-31 ENCOUNTER — Ambulatory Visit: Payer: Self-pay | Attending: Internal Medicine | Admitting: Internal Medicine

## 2015-07-31 ENCOUNTER — Encounter: Payer: Self-pay | Admitting: Internal Medicine

## 2015-07-31 VITALS — BP 106/73 | HR 96 | Temp 98.3°F | Resp 16 | Ht 61.0 in | Wt 172.0 lb

## 2015-07-31 DIAGNOSIS — F329 Major depressive disorder, single episode, unspecified: Secondary | ICD-10-CM | POA: Insufficient documentation

## 2015-07-31 DIAGNOSIS — Z79899 Other long term (current) drug therapy: Secondary | ICD-10-CM | POA: Insufficient documentation

## 2015-07-31 DIAGNOSIS — Z760 Encounter for issue of repeat prescription: Secondary | ICD-10-CM | POA: Insufficient documentation

## 2015-07-31 DIAGNOSIS — F419 Anxiety disorder, unspecified: Secondary | ICD-10-CM | POA: Insufficient documentation

## 2015-07-31 DIAGNOSIS — G4700 Insomnia, unspecified: Secondary | ICD-10-CM | POA: Insufficient documentation

## 2015-07-31 DIAGNOSIS — F418 Other specified anxiety disorders: Secondary | ICD-10-CM

## 2015-07-31 MED ORDER — CITALOPRAM HYDROBROMIDE 20 MG PO TABS: 20.0000 mg | ORAL_TABLET | Freq: Every day | ORAL | Status: DC

## 2015-07-31 MED ORDER — RAMELTEON 8 MG PO TABS: 8.0000 mg | ORAL_TABLET | Freq: Every day | ORAL | Status: AC

## 2015-07-31 NOTE — Progress Notes (Signed)
Patient's here for med refill. Pt requesting rx for depression.   Patient decline A1c screening. She reports having one done.

## 2015-07-31 NOTE — Patient Instructions (Signed)
Trastorno depresivo mayor  (Major Depressive Disorder)  El trastorno depresivo mayor es una enfermedad mental. También se llamado depresión clínica o depresión unipolar. Produce sentimientos de tristeza, desesperanza o desamparo. Algunas personas con trastorno depresivo mayor no se sienten particularmente tristes, pero pierden el interés en hacer las cosas que solían disfrutar (anhedonia). También puede causar síntomas físicos. Interfiere en el trabajo, la escuela, las relaciones y otras actividades diarias normales. Puede variar en gravedad, pero es más duradera y más grave que la tristeza que todos sentimos de vez en cuando en nuestras vidas.   Muchas veces es desencadenada por sucesos estresantes o cambios importantes en la vida. Algunos ejemplos de estos factores desencadenantes son el divorcio, la pérdida del trabajo o el hogar, una mudanza, y la muerte de un familiar o amigo cercano. A veces aparece sin ninguna razón evidente. Las personas que tienen familiares con depresión mayor o con trastorno bipolar tienen más riesgo de desarrollar depresión mayor con o sin factores de estrés. Puede ocurrir a cualquier edad. Puede ocurrir sólo una vez en su vida(episodio único de trastorno depresivo mayor). Puede ocurrir varias veces (trastorno depresivo mayor recurrente).   SÍNTOMAS   Las personas con trastorno depresivo mayor presentan anhedonia o estado de ánimo deprimido casi todos los días durante al menos 2 semanas o más. Los síntomas son:   · Sensación de tristeza (melancolía) o vacío.  · Sentimientos de desesperanza o desamparo.  · Lagrimeo o episodios de llanto ( es observado por los demás).  · Irritabilidad (en niños y adolescentes).  Además del estado de ánimo deprimido o la anhedonia o ambos, estos enfermos tienen al menos cuatro de los siguientes síntomas :   · Dificultad para dormir o dormir demasiado.    · Cambio significativo (aumento o disminución) en el apetito o el peso.    · Falta de energía o  motivación.  · Sentimientos de culpa o desvalorización.    · Dificultad para concentrarse, recordar o tomar decisiones.  · Movimientos inusualmente lentos (retardo psicomotor retardation) o inquietud (según lo observado por los demás).    · Deseos recurrentes de muerte, pensamientos recurrentes de autoagresión (suicidio) o intento de suicidio.  Las personas con trastorno depresivo mayor suelen tener pensamientos persistentes negativos acerca de sí mismos, de otras personas y del mundo. Las personas con trastorno depresivo mayor grave pueden experimentar creencias o percepciones distorsionadas sobre el mundo (delirios psicóticos). También pueden ver u oír cosas que no son reales(alucinaciones psicóticas).   DIAGNÓSTICO   El diagnóstico se realiza mediante una evaluación hecha por el médico. El médico le preguntará acerca de los aspectos de su vida cotidiana, como el estado de ánimo, el sueño y el apetito, para ver si usted tiene los síntomas de depresión mayor. Le hará preguntas sobre su historial médico y el consumo de alcohol o drogas, incluyendo medicamentos recetados. También le hará un examen físico y le indicará análisis de sangre. Esto se debe a que ciertas enfermedades y el uso de determinadas sustancias pueden causar síntomas similares a la depresión (depresión secundaria). Su médico también podría derivarlo a un especialista en salud mental para una evaluación y tratamiento.   TRATAMIENTO   Es importante reconocer los síntomas y buscar tratamiento. Los siguientes tratamientos pueden indicarse:     · Medicamentos - Generalmente se recetan antidepresivos. Los antidepresivos se piensa que corrigen los desequilibrios químicos en el cerebro que se asocian comúnmente a la depresión mayor. Se pueden agregar otros tipos de medicamentos si los síntomas no responden a los antidepresivos solos o si   hay ideas delirantes o alucinaciones psicóticas.  · Psicoterapia - ciertos tipos de psicoterapia pueden ser útiles en el  tratamiento del trastorno de la depresión mayor, proporcionando apoyo, educación y orientación. Ciertos tipos de psicoterapia también pueden ayudar a superar los pensamientos negativos (terapia cognitivo conductual) y a problemas de relación que desencadena la depresión mayor (terapia interpersonal).  Un especialista en salud mental puede ayudarlo a determinar qué tratamiento es el mejor para usted. La mayoría de los pacientes mejoran con una combinación de medicación y psicoterapia. Los tratamientos que implican la estimulación eléctrica del cerebro pueden ser utilizados en situaciones con síntomas muy graves o cuando los medicamentos y la psicoterapia no funcionan después de un tiempo. Estos tratamientos incluyen terapia electroconvulsiva, estimulación magnética transcraneal y estimulación del nervio vago.      Esta información no tiene como fin reemplazar el consejo del médico. Asegúrese de hacerle al médico cualquier pregunta que tenga.     Document Released: 10/16/2012 Document Revised: 07/12/2014  Elsevier Interactive Patient Education ©2016 Elsevier Inc.

## 2015-07-31 NOTE — Progress Notes (Signed)
Patient ID: Ashley Garza, female   DOB: August 09, 1980, 35 y.o.   MRN: [id number]   Ashley Garza, is a 35 y.o. female  [id number]  [id number]  DOB - 03-28-1981  Chief Complaint  Patient presents with  . Medication Refill        Subjective:   Ashley Garza is a 35 y.o. female with no significant past medical history here today for a follow up visit. Patient is requesting medication for depression. Patient claims that she has been depressed most of her life and used to be on depression medication but stopped about 6 months to 1 year ago. She feels irritated very easily, she is not interested in most of the things she used to be interested in, she does not sleep well at night, sometimes sleeps for only a couple of hours. She lives at home with her 3 children and husband. She does not know any inciting factors, she does not think she is stressed out, she denies any domestic violence. She does not smoke cigarette, she does not drink alcohol, she denies any use of illicit drugs. She denies any suicidal ideations or thoughts. She thinks overall she has good quality of life she just does not know why she is depressed and easily irritated. Her evaluation for thyroid dysfunction was normal. Patient has No headache, No chest pain, No abdominal pain - No Nausea, No new weakness tingling or numbness, No Cough - SOB.  Problem  Anxiety and Depression  Insomnia    ALLERGIES: No Known Allergies  PAST MEDICAL HISTORY: Past Medical History  Diagnosis Date  . No pertinent past medical history     MEDICATIONS AT HOME: Prior to Admission medications   Medication Sig Start Date End Date Taking? Authorizing Provider  citalopram (CELEXA) 20 MG tablet Take 1 tablet (20 mg total) by mouth daily. 07/31/15   Quentin Angst, MD  ibuprofen (ADVIL,MOTRIN) 800 MG tablet Take 1 tablet (800 mg total) by mouth every 6 (six) hours as needed. Patient not taking:  Reported on 12/26/2014 12/20/13   Quentin Angst, MD  metroNIDAZOLE (FLAGYL) 500 MG tablet Take 1 tablet (500 mg total) by mouth 2 (two) times daily. Patient not taking: Reported on 12/26/2014 10/11/13   Quentin Angst, MD  ramelteon (ROZEREM) 8 MG tablet Take 1 tablet (8 mg total) by mouth at bedtime. 07/31/15   Quentin Angst, MD  sulfamethoxazole-trimethoprim (BACTRIM DS) 800-160 MG per tablet Take 1 tablet by mouth 2 (two) times daily. Patient not taking: Reported on 12/26/2014 12/20/13   Quentin Angst, MD     Objective:   Filed Vitals:   07/31/15 1402  BP: 106/73  Pulse: 96  Temp: 98.3 F (36.8 C)  TempSrc: Oral  Resp: 16  Height:  (1.549 m)  Weight: 172 lb (78.019 kg)  SpO2: 99%    Exam General appearance : Awake, alert, not in any distress. Speech Clear. Not toxic looking, obese  HEENT: Atraumatic and Normocephalic, pupils equally reactive to light and accomodation Neck: supple, no JVD. No cervical lymphadenopathy.  Chest:Good air entry bilaterally, no added sounds  CVS: S1 S2 regular, no murmurs.  Abdomen: Bowel sounds present, Non tender and not distended with no gaurding, rigidity or rebound. Extremities: B/L Lower Ext shows no edema, both legs are warm to touch Neurology: Awake alert, and oriented X 3, CN II-XII intact, Non focal Skin:No Rash  Data Review Lab Results  Component Value Date   HGBA1C 6.0 12/26/2014   HGBA1C 5.6  10/04/2013     Assessment & Plan   1. Anxiety and depression  - citalopram (CELEXA) 20 MG tablet; Take 1 tablet (20 mg total) by mouth daily.  Dispense: 30 tablet; Refill: 3  2. Insomnia  - citalopram (CELEXA) 20 MG tablet; Take 1 tablet (20 mg total) by mouth daily.  Dispense: 30 tablet; Refill: 3 - ramelteon (ROZEREM) 8 MG tablet; Take 1 tablet (8 mg total) by mouth at bedtime.  Dispense: 30 tablet; Refill: 3 - Referral to licensed clinical social worker for counseling and support. Patient have been counseled  extensively about nutrition and exercise  Interpreter was used to communicate directly with patient for the entire encounter including providing detailed patient instructions.   Return in about 6 months (around 01/28/2016) for Generalized Anxiety Disorder, Routine Follow Up.  The patient was given clear instructions to go to ER or return to medical center if symptoms don't improve, worsen or new problems develop. The patient verbalized understanding. The patient was told to call to get lab results if they haven't heard anything in the next week.   This note has been created with Education officer, environmental. Any transcriptional errors are unintentional.    Jeanann Lewandowsky, MD, MHA, Maxwell Caul, CPE [facility] [place], [place] [phone]   07/31/2015, 2:51 PM

## 2015-09-12 ENCOUNTER — Telehealth: Payer: Self-pay | Admitting: Internal Medicine

## 2015-09-12 NOTE — Telephone Encounter (Signed)
Patient was prescribed ramelteon (ROZEREM) 8 MG tablet [[id number]][[id number]]. Medication cost is 400, pharmacy called to see if it could be switch to Ambien....  [facility] [address]

## 2015-09-23 NOTE — Telephone Encounter (Signed)
Request sent to PCP

## 2015-10-07 NOTE — Telephone Encounter (Signed)
MA spoke with pharmacy and informed them of PCP denying the request to switch to Ambien. Pharmacist noted the denial in the system.

## 2015-10-07 NOTE — Telephone Encounter (Signed)
No Ambien please

## 2015-11-04 ENCOUNTER — Encounter (HOSPITAL_COMMUNITY): Payer: Self-pay | Admitting: Emergency Medicine

## 2015-11-04 ENCOUNTER — Emergency Department (HOSPITAL_COMMUNITY): Payer: Self-pay

## 2015-11-04 DIAGNOSIS — Z79899 Other long term (current) drug therapy: Secondary | ICD-10-CM | POA: Insufficient documentation

## 2015-11-04 DIAGNOSIS — R000 Tachycardia, unspecified: Secondary | ICD-10-CM | POA: Insufficient documentation

## 2015-11-04 DIAGNOSIS — J40 Bronchitis, not specified as acute or chronic: Secondary | ICD-10-CM | POA: Insufficient documentation

## 2015-11-04 DIAGNOSIS — Z7952 Long term (current) use of systemic steroids: Secondary | ICD-10-CM | POA: Insufficient documentation

## 2015-11-04 LAB — BASIC METABOLIC PANEL
Anion gap: 9 (ref 5–15)
BUN: 8 mg/dL (ref 6–20)
CO2: 24 mmol/L (ref 22–32)
Calcium: 9 mg/dL (ref 8.9–10.3)
Chloride: 105 mmol/L (ref 101–111)
Creatinine, Ser: 0.76 mg/dL (ref 0.44–1.00)
GFR calc Af Amer: >60 mL/min (ref >60)
Glucose, Bld: 102 mg/dL — ABNORMAL HIGH (ref 65–99)
POTASSIUM: 4.1 mmol/L (ref 3.5–5.1)
SODIUM: 138 mmol/L (ref 135–145)

## 2015-11-04 LAB — CBC
HEMATOCRIT: 40.5 % (ref 36.0–46.0)
Hemoglobin: 13.2 g/dL (ref 12.0–15.0)
MCH: 24.9 pg — ABNORMAL LOW (ref 26.0–34.0)
MCHC: 32.6 g/dL (ref 30.0–36.0)
MCV: 76.3 fL — ABNORMAL LOW (ref 78.0–100.0)
PLATELETS: 346 10*3/uL (ref 150–400)
RBC: 5.31 MIL/uL — ABNORMAL HIGH (ref 3.87–5.11)
RDW: 15.7 % — AB (ref 11.5–15.5)
WBC: 13.4 10*3/uL — AB (ref 4.0–10.5)

## 2015-11-04 LAB — I-STAT TROPONIN, ED: TROPONIN I, POC: 0 ng/mL (ref 0.00–0.08)

## 2015-11-04 NOTE — ED Notes (Signed)
Pt. reports SOB with chest tightness and productive cough onset 2 days ago , denies fever , no nausea or diaphoresis .

## 2015-11-05 ENCOUNTER — Emergency Department (HOSPITAL_COMMUNITY)
Admission: EM | Admit: 2015-11-05 | Discharge: 2015-11-05 | Disposition: A | Discharge status: Home / Self Care | Payer: Self-pay | Attending: Emergency Medicine | Admitting: Emergency Medicine

## 2015-11-05 DIAGNOSIS — J40 Bronchitis, not specified as acute or chronic: Secondary | ICD-10-CM

## 2015-11-05 MED ORDER — IBUPROFEN 400 MG PO TABS: 400.0000 mg | ORAL_TABLET | Freq: Four times a day (QID) | ORAL | Status: AC | PRN

## 2015-11-05 MED ORDER — PREDNISONE 20 MG PO TABS: 60.0000 mg | ORAL_TABLET | Freq: Every day | ORAL | Status: AC

## 2015-11-05 MED ORDER — KETOROLAC TROMETHAMINE 30 MG/ML IJ SOLN
30.0000 mg | Freq: Once | INTRAMUSCULAR | Status: AC
  Administered 2015-11-05: 30 mg via INTRAMUSCULAR
  Filled 2015-11-05: qty 1

## 2015-11-05 MED ORDER — ALBUTEROL SULFATE HFA 108 (90 BASE) MCG/ACT IN AERS
2.0000 | INHALATION_SPRAY | RESPIRATORY_TRACT | Status: DC | PRN
  Filled 2015-11-05: qty 6.7

## 2015-11-05 MED ORDER — IPRATROPIUM-ALBUTEROL 0.5-2.5 (3) MG/3ML IN SOLN
3.0000 mL | Freq: Once | RESPIRATORY_TRACT | Status: AC
  Administered 2015-11-05: 3 mL via RESPIRATORY_TRACT
  Filled 2015-11-05: qty 3

## 2015-11-05 MED ORDER — ALBUTEROL SULFATE HFA 108 (90 BASE) MCG/ACT IN AERS: 2.0000 | INHALATION_SPRAY | RESPIRATORY_TRACT | Status: AC | PRN

## 2015-11-05 MED ORDER — PREDNISONE 20 MG PO TABS
60.0000 mg | ORAL_TABLET | Freq: Once | ORAL | Status: AC
  Administered 2015-11-05: 60 mg via ORAL
  Filled 2015-11-05: qty 3

## 2015-11-05 NOTE — ED Notes (Signed)
Pt and friend verbalized understanding of discharge instructions and follow-up care.

## 2015-11-05 NOTE — ED Provider Notes (Signed)
CSN: [id number]     Arrival date & time 11/04/15  2301 History   First MD Initiated Contact with Patient 11/05/15 [id number]     Chief Complaint  Patient presents with  . Shortness of Breath     (Consider location/radiation/quality/duration/timing/severity/associated sxs/prior Treatment) HPI  This a 35 year old who presents with chest pain shortness of breath. Patient reports 2 day history of chest tightness, cough, and shortness of breath. No history of asthma or smoking. Cough is nonproductive. No fevers.  Denies any leg swelling or history of blood clots.    Past Medical History  Diagnosis Date  . No pertinent past medical history    Past Surgical History  Procedure Laterality Date  . Cholestectomy  09/2010  . Cesarean section  2008  . Cesarean section  10/30/2011    Procedure: CESAREAN SECTION;  Surgeon: Reva Boresanya S Pratt, MD;  Location: [facility];  Service: Gynecology;  Laterality: N/A;  Repeat cesarean section with delivery of baby girl at [id number].   Marland Kitchen. Cholecystectomy     Family History  Problem Relation Age of Onset  . Cancer Brother     stomach  . Cancer Maternal Grandmother     stomach   Social History  Substance Use Topics  . Smoking status: Never Smoker   . Smokeless tobacco: Never Used  . Alcohol Use: No   OB History    Gravida Para Term Preterm AB TAB SAB Ectopic Multiple Living   3 2 1 1 1  0 1 0 0 1     Review of Systems  Constitutional: Negative for fever.  Respiratory: Positive for cough and shortness of breath. Negative for chest tightness.   Cardiovascular: Positive for chest pain. Negative for palpitations and leg swelling.  Gastrointestinal: Negative for nausea, vomiting and abdominal pain.  All other systems reviewed and are negative.     Allergies  Review of patient's allergies indicates no known allergies.  Home Medications   Prior to Admission medications   Medication Sig Start Date End Date Taking? Authorizing Provider  ramelteon (ROZEREM) 8 MG  tablet Take 1 tablet (8 mg total) by mouth at bedtime. 07/31/15  Yes Quentin Angstlugbemiga E Jegede, MD  albuterol (PROVENTIL HFA;VENTOLIN HFA) 108 (90 Base) MCG/ACT inhaler Inhale 2 puffs into the lungs every 4 (four) hours as needed for wheezing or shortness of breath. 11/05/15   Shon Batonourtney F Tamora Huneke, MD  ibuprofen (ADVIL,MOTRIN) 400 MG tablet Take 1 tablet (400 mg total) by mouth every 6 (six) hours as needed. 11/05/15   Shon Batonourtney F Italo Banton, MD  predniSONE (DELTASONE) 20 MG tablet Take 3 tablets (60 mg total) by mouth daily with breakfast. 11/05/15   Shon Batonourtney F Theora Vankirk, MD   BP 102/73 mmHg  Pulse 97  Temp(Src) 98.4 F (36.9 C) (Oral)  Resp 25  SpO2 99%  LMP 10/07/2015 Physical Exam  Constitutional: She is oriented to person, place, and time. She appears well-developed and well-nourished.  HENT:  Head: Normocephalic and atraumatic.  Cardiovascular: Normal rate, regular rhythm and normal heart sounds.   No murmur heard. Pulmonary/Chest: Effort normal. No respiratory distress. She has wheezes. She exhibits tenderness.  Abdominal: Soft. Bowel sounds are normal. She exhibits no distension. There is no tenderness.  Musculoskeletal: She exhibits no edema.  Neurological: She is alert and oriented to person, place, and time.  Skin: Skin is warm and dry.  Psychiatric: She has a normal mood and affect.  Nursing note and vitals reviewed.   ED Course  Procedures (including critical care time) Labs  Review Labs Reviewed  BASIC METABOLIC PANEL - Abnormal; Notable for the following:    Glucose, Bld 102 (*)    All other components within normal limits  CBC - Abnormal; Notable for the following:    WBC 13.4 (*)    RBC 5.31 (*)    MCV 76.3 (*)    MCH 24.9 (*)    RDW 15.7 (*)    All other components within normal limits  I-STAT TROPOININ, ED    Imaging Review Dg Chest 2 View  11/04/2015  CLINICAL DATA:  Dyspnea and chest tightness for 4 days EXAM: CHEST  2 VIEW COMPARISON:  None. FINDINGS: The lungs are clear.  The pulmonary vasculature is normal. Heart size is normal. Hilar and mediastinal contours are unremarkable. There is no pleural effusion. IMPRESSION: No active cardiopulmonary disease. Electronically Signed   By: Ellery Plunk M.D.   On: 11/04/2015 23:49   I have personally reviewed and evaluated these images and lab results as part of my medical decision-making.   EKG Interpretation   Date/Time:  Tuesday Nov 04 2015 23:06:00 EDT Ventricular Rate:  105 PR Interval:  130 QRS Duration: 66 QT Interval:  326 QTC Calculation: 430 R Axis:   85 Text Interpretation:  Sinus tachycardia Nonspecific T wave abnormality  Abnormal ECG Confirmed by Wen Munford  MD, Emmalie Haigh ([id number]) on 11/05/2015 4:36:17  AM      MDM   Final diagnoses:  Bronchitis    *Patient presents with cough, chest pain, shortness of breath. Nontoxic on exam. Mildly tachycardic. She has wheezing and reproducible chest wall pain. Given cough, suspect upper respiratory virus causing bronchitis. Patient was given prednisone and a DuoNeb. While she is mildly tachycardic, suspect her symptoms are related to acute bronchitis. Doubt PE.  On recheck, patient reports improvement of symptoms. Her lung sounds have improved. Will discharge home with prednisone, inhaler, and ibuprofen for chest wall pain.  After history, exam, and medical workup I feel the patient has been appropriately medically screened and is safe for discharge home. Pertinent diagnoses were discussed with the patient. Patient was given return precautions.     Shon Baton, MD 11/05/15 [phone]

## 2015-11-05 NOTE — Discharge Instructions (Signed)
Bronquitis aguda  (Acute Bronchitis)  La bronquitis es una inflamación de las vías respiratorias que se extienden desde la tráquea hasta los pulmones (bronquios). La inflamación produce la formación de mucosidad. Esto produce tos, que es el síntoma más frecuente de la bronquitis.   Cuando la bronquitis es aguda, generalmente comienza de manera súbita y desaparece luego de un par de semanas. El hábito de fumar, las alergias y el asma pueden empeorar la bronquitis. Los episodios repetidos de bronquitis pueden causar más problemas pulmonares.   CAUSAS  La causa más frecuente de bronquitis aguda es el mismo virus que produce el resfrío. El virus puede propagarse de una persona a la otra (contagioso) a través de la tos y los estornudos, y al tocar objetos contaminados.  SIGNOS Y SÍNTOMAS   · Tos.  · Fiebre.  · Tos con mucosidad.  · Dolores en el cuerpo.  · Congestión en el pecho.  · Escalofríos.  · Falta de aire.  · Dolor de garganta.  DIAGNÓSTICO   La bronquitis aguda en general se diagnostica con un examen físico. El médico también le hará preguntas sobre su historia clínica. En algunos casos se indican otros estudios, como radiografías, para descartar otras enfermedades.   TRATAMIENTO   La bronquitis aguda generalmente desaparece en un par de semanas. Con frecuencia, no es necesario realizar un tratamiento. Los medicamentos se indican para aliviar la fiebre o la tos. Generalmente, no es necesario el uso de antibióticos, pero pueden recetarse en ciertas ocasiones. En algunos casos, se recomienda el uso de un inhalador para mejorar la falta de aire y controlar la tos. Un vaporizador de aire frío podrá ayudarlo a disolver las secreciones bronquiales y facilitar su eliminación.   INSTRUCCIONES PARA EL CUIDADO EN EL HOGAR   · Descanse lo suficiente.  · Beba líquidos en abundancia para mantener la orina de color claro o amarillo pálido (excepto que padezca una enfermedad que requiera la restricción de líquidos). El aumento  de líquidos puede ayudar a que las secreciones respiratorias (esputo) sean menos espesas y a reducir la congestión del pecho, y evitará la deshidratación.  · Tome los medicamentos solamente como se lo haya indicado el médico.  · Si le recetaron antibióticos, asegúrese de terminarlos, incluso si comienza a sentirse mejor.  · Evite fumar o aspirar el humo de otros fumadores. La exposición al humo del cigarrillo o a irritantes químicos hará que la bronquitis empeore. Si fuma, considere el uso de goma de mascar o la aplicación de parches en la piel que contengan nicotina para aliviar los síntomas de abstinencia. Si deja de fumar, sus pulmones se curarán más rápido.  · Reduzca la probabilidad de otro episodio de bronquitis aguda lavando sus manos con frecuencia, evitando a las personas que tengan síntomas y tratando de no tocarse las manos con la boca, la nariz o los ojos.  · Concurra a todas las visitas de control como se lo haya indicado el médico.  SOLICITE ATENCIÓN MÉDICA SI:  Los síntomas no mejoran después de una semana de tratamiento.   SOLICITE ATENCIÓN MÉDICA DE INMEDIATO SI:  · Comienza a tener fiebre o escalofríos cada vez más intensos.  · Siente dolor en el pecho.  · Le falta el aire de manera preocupante.  · La flema tiene sangre.  · Se deshidrata.  · Se desmaya o siente que va a desmayarse de forma repetida.  · Tiene vómitos que se repiten.  · Tiene un dolor de cabeza intenso.  ASEGÚRESE DE QUE:   ·   Comprende estas instrucciones.  · Controlará su afección.  · Recibirá ayuda de inmediato si no mejora o si empeora.     Esta información no tiene como fin reemplazar el consejo del médico. Asegúrese de hacerle al médico cualquier pregunta que tenga.     Document Released: 06/21/2005 Document Revised: 07/12/2014  Elsevier Interactive Patient Education ©2016 [employer]

## 2017-05-09 ENCOUNTER — Other Ambulatory Visit (HOSPITAL_COMMUNITY): Payer: Self-pay | Admitting: *Deleted

## 2017-05-09 DIAGNOSIS — N6310 Unspecified lump in the right breast, unspecified quadrant: Secondary | ICD-10-CM

## 2017-05-11 ENCOUNTER — Encounter (HOSPITAL_COMMUNITY): Payer: Self-pay | Admitting: *Deleted

## 2017-05-12 ENCOUNTER — Ambulatory Visit (HOSPITAL_COMMUNITY)
Admission: RE | Admit: 2017-05-12 | Discharge: 2017-05-12 | Disposition: A | Discharge status: Home / Self Care | Payer: Self-pay | Source: Ambulatory Visit | Attending: Obstetrics and Gynecology | Admitting: Obstetrics and Gynecology

## 2017-05-12 ENCOUNTER — Ambulatory Visit
Admission: RE | Admit: 2017-05-12 | Discharge: 2017-05-12 | Disposition: A | Discharge status: Home / Self Care | Payer: No Typology Code available for payment source | Source: Ambulatory Visit | Attending: Obstetrics and Gynecology | Admitting: Obstetrics and Gynecology

## 2017-05-12 ENCOUNTER — Encounter (HOSPITAL_COMMUNITY): Payer: Self-pay

## 2017-05-12 ENCOUNTER — Ambulatory Visit: Payer: Self-pay

## 2017-05-12 ENCOUNTER — Other Ambulatory Visit (HOSPITAL_COMMUNITY): Payer: Self-pay | Admitting: Obstetrics and Gynecology

## 2017-05-12 VITALS — BP 102/68 | Temp 98.3°F | Ht 62.0 in | Wt 160.8 lb

## 2017-05-12 DIAGNOSIS — N6310 Unspecified lump in the right breast, unspecified quadrant: Secondary | ICD-10-CM

## 2017-05-12 DIAGNOSIS — Z1239 Encounter for other screening for malignant neoplasm of breast: Secondary | ICD-10-CM

## 2017-05-12 NOTE — Progress Notes (Signed)
Complaints of right upper outer quadrant breast mass x 1 week that patient states that it is painful. Patient states the pain comes and goes. Patient rates the pain at a 4 out of 10.  Pap Smear: Pap smear not completed today. [facility] and normal. Per patient has no history of an abnormal Pap smear. Last Pap smear result is in Epic.  Physical exam: Breasts Breasts symmetrical. No skin abnormalities bilateral breasts. No nipple retraction bilateral breasts. No nipple discharge bilateral breasts. No lymphadenopathy. No lumps palpated left breast. Palpated a right upper outer quadrant breast mass. Complaints of right upper outer breast tenderness on exam. [facility] and possible right breast ultrasound. Appointment scheduled for Thursday, May 12, 2017 at 1120.        Pelvic/Bimanual No Pap smear completed today since last Pap smear was 10/22/2016. Pap smear not indicated per BCCCP guidelines.   Smoking History: Patient has never smoked.  Patient Navigation: Patient education provided. [facility]. Spanish interpreter provided.  Used Spanish interpreter [employer] Her[employer] from [employer].

## 2017-05-12 NOTE — Patient Instructions (Signed)
Explained breast self awareness with Ashley Garza. Patient did not need a Pap smear today due to last Pap smear was 10/22/2016. Let her know B[facility]CP will cover Pap smears every 3 years unless has a history of abnormal Pap smears. [facility] and possible right breast ultrasound. Appointment scheduled for Thursday, May 12, 2017 at 1120. Ashley Garza verbalized understanding.  Ayelet Gruenewald, Kathaleen Maserhristine Poll, RN 11:33 AM

## 2017-05-13 ENCOUNTER — Encounter (HOSPITAL_COMMUNITY): Payer: Self-pay | Admitting: *Deleted
